# Patient Record
Sex: Female | Born: 1965
Health system: Southern US, Community
[De-identification: ages and names within clinical notes are randomized; demographics above are authoritative.]

## PROBLEM LIST (undated history)

## (undated) DIAGNOSIS — K219 Gastro-esophageal reflux disease without esophagitis: Secondary | ICD-10-CM

## (undated) DIAGNOSIS — F439 Reaction to severe stress, unspecified: Secondary | ICD-10-CM

## (undated) HISTORY — PX: FOOT SURGERY: SHX648

## (undated) HISTORY — DX: Reaction to severe stress, unspecified: F43.9

## (undated) HISTORY — PX: HALLUX VALGUS CORRECTION: SUR315

## (undated) HISTORY — PX: TONSILLECTOMY AND ADENOIDECTOMY: SUR1326

## (undated) HISTORY — PX: REFRACTIVE SURGERY: SHX103

## (undated) HISTORY — PX: DILATION AND CURETTAGE OF UTERUS: SHX78

## (undated) HISTORY — PX: TOE SURGERY: SHX1073

## (undated) HISTORY — PX: EYE SURGERY: SHX253

---

## 2004-05-25 ENCOUNTER — Observation Stay: Payer: Self-pay | Admitting: Unknown Physician Specialty

## 2004-06-15 ENCOUNTER — Inpatient Hospital Stay: Payer: Self-pay | Admitting: Unknown Physician Specialty

## 2004-12-23 ENCOUNTER — Ambulatory Visit: Payer: Self-pay | Admitting: Unknown Physician Specialty

## 2006-11-17 ENCOUNTER — Ambulatory Visit: Payer: Self-pay | Admitting: Unknown Physician Specialty

## 2007-12-28 ENCOUNTER — Ambulatory Visit: Payer: Self-pay | Admitting: Unknown Physician Specialty

## 2009-08-29 ENCOUNTER — Ambulatory Visit: Payer: Self-pay | Admitting: Unknown Physician Specialty

## 2010-01-07 ENCOUNTER — Ambulatory Visit: Payer: Self-pay | Admitting: Unknown Physician Specialty

## 2010-01-15 ENCOUNTER — Ambulatory Visit: Payer: Self-pay | Admitting: Unknown Physician Specialty

## 2010-01-20 LAB — PATHOLOGY REPORT

## 2010-06-17 ENCOUNTER — Ambulatory Visit: Payer: Self-pay | Admitting: Podiatry

## 2010-08-12 ENCOUNTER — Ambulatory Visit: Payer: Self-pay | Admitting: Unknown Physician Specialty

## 2011-08-31 ENCOUNTER — Ambulatory Visit: Payer: Self-pay | Admitting: Unknown Physician Specialty

## 2012-04-20 ENCOUNTER — Ambulatory Visit: Payer: Self-pay | Admitting: Unknown Physician Specialty

## 2013-02-16 ENCOUNTER — Ambulatory Visit: Payer: Self-pay | Admitting: Physician Assistant

## 2013-02-16 LAB — HCG, QUANTITATIVE, PREGNANCY: Beta Hcg, Quant.: <1 m[IU]/mL — ABNORMAL LOW

## 2013-08-06 ENCOUNTER — Ambulatory Visit: Payer: Self-pay | Admitting: Gastroenterology

## 2013-08-07 LAB — PATHOLOGY REPORT

## 2014-05-31 ENCOUNTER — Encounter: Payer: Self-pay | Admitting: Podiatry

## 2014-05-31 ENCOUNTER — Ambulatory Visit (INDEPENDENT_AMBULATORY_CARE_PROVIDER_SITE_OTHER): Payer: No Typology Code available for payment source

## 2014-05-31 ENCOUNTER — Ambulatory Visit (INDEPENDENT_AMBULATORY_CARE_PROVIDER_SITE_OTHER): Payer: No Typology Code available for payment source | Admitting: Podiatry

## 2014-05-31 VITALS — BP 141/90 | HR 87 | Resp 16 | Ht 69.0 in | Wt 175.0 lb

## 2014-05-31 DIAGNOSIS — M722 Plantar fascial fibromatosis: Secondary | ICD-10-CM

## 2014-05-31 MED ORDER — OXAPROZIN 600 MG PO TABS: 600.0000 mg | ORAL_TABLET | Freq: Every day | ORAL | Status: DC

## 2014-05-31 MED ORDER — TRIAMCINOLONE ACETONIDE 10 MG/ML IJ SUSP
10.0000 mg | Freq: Once | INTRAMUSCULAR | Status: AC
  Administered 2014-05-31: 10 mg

## 2014-05-31 NOTE — Patient Instructions (Signed)

## 2014-05-31 NOTE — Progress Notes (Signed)
   Subjective:    Patient ID: Tiffany Mata, female    DOB: 1965-09-10, 48 y.o.   MRN: [id number]  HPI Comments: My rt heel hurts in the back but the pain does not go up the leg. This has been going on for 3.5 months. i can be standing up and it will shoot pain. Walking hurts. The pain is getting worse. In the mornings and after sitting will hurt. i have used ice, ibuprofen and that's it.   Foot Pain      Review of Systems  All other systems reviewed and are negative.      Objective:   Physical Exam        Assessment & Plan:

## 2014-06-02 NOTE — Progress Notes (Signed)
Subjective:     Patient ID: Tiffany Mata, female   DOB: [birth year], 48 y.o.   MRN: [id number]  HPI patient presents stating I'm getting a lot of pain in the plantar aspect of my right heel that is been going on for about 4 months. It seems to be getting worse and increasingly affecting my activity levels   Review of Systems  All other systems reviewed and are negative.      Objective:   Physical Exam  Constitutional: She is oriented to person, place, and time.  Cardiovascular: Intact distal pulses.   Musculoskeletal: Normal range of motion.  Neurological: She is oriented to person, place, and time.  Skin: Skin is warm.  Nursing note and vitals reviewed.  neurovascular status intact with muscle strength adequate and range of motion subtalar midtarsal joint within normal limits. Patient is noted to have intense discomfort plantar aspect right heel    patient's digits are found to be well perfused she is well oriented 3 and does have moderate depression of the arch upon weightbearing  Assessment:     Severe plantar fasciitis right with mechanical dysfunction     Plan:     H&P and x-rays reviewed and today I injected the right plantar fascia 3 mg Kenalog 5 mg Xylocaine Marcaine mixture placed in a fascially brace and placed on diclofenac 75 mg twice a day. Instructed on physical therapy and reappoint in 1 week

## 2014-06-07 ENCOUNTER — Ambulatory Visit (INDEPENDENT_AMBULATORY_CARE_PROVIDER_SITE_OTHER): Payer: No Typology Code available for payment source | Admitting: Podiatry

## 2014-06-07 VITALS — BP 128/85 | HR 90 | Resp 16

## 2014-06-07 DIAGNOSIS — M722 Plantar fascial fibromatosis: Secondary | ICD-10-CM

## 2014-06-07 MED ORDER — TRIAMCINOLONE ACETONIDE 10 MG/ML IJ SUSP
10.0000 mg | Freq: Once | INTRAMUSCULAR | Status: AC
  Administered 2014-06-07: 10 mg

## 2014-06-08 NOTE — Progress Notes (Signed)
Subjective:     Patient ID: Tiffany Mata, female   DOB: March 30, 1966, 48 y.o.   MRN: [id number]  HPI patient states it's improving but it still painful on the plantar aspect right heel and one spot   Review of Systems     Objective:   Physical Exam Neurovascular status intact with continued pain of a diminished nature plantar aspect right heel at the insertion of the calcaneus    Assessment:     Plantar fasciitis right with inflammation and fluid buildup noted    Plan:     Reviewed condition and discussed physical therapy shoe gear modification and reinjected the plantar fascia 3 mg Kenalog 5 mg Xylocaine and reappoint if symptoms persist

## 2014-09-02 ENCOUNTER — Ambulatory Visit: Payer: Self-pay | Admitting: Family Medicine

## 2014-09-10 ENCOUNTER — Ambulatory Visit (INDEPENDENT_AMBULATORY_CARE_PROVIDER_SITE_OTHER): Payer: No Typology Code available for payment source | Admitting: Family Medicine

## 2014-09-10 ENCOUNTER — Encounter (INDEPENDENT_AMBULATORY_CARE_PROVIDER_SITE_OTHER): Payer: Self-pay

## 2014-09-10 ENCOUNTER — Encounter: Payer: Self-pay | Admitting: Family Medicine

## 2014-09-10 VITALS — BP 120/88 | HR 73 | Temp 97.8°F | Ht 68.75 in | Wt 175.5 lb

## 2014-09-10 DIAGNOSIS — Z0000 Encounter for general adult medical examination without abnormal findings: Secondary | ICD-10-CM | POA: Diagnosis not present

## 2014-09-10 DIAGNOSIS — Z638 Other specified problems related to primary support group: Secondary | ICD-10-CM | POA: Diagnosis not present

## 2014-09-10 DIAGNOSIS — Z01419 Encounter for gynecological examination (general) (routine) without abnormal findings: Secondary | ICD-10-CM

## 2014-09-10 DIAGNOSIS — Z720 Tobacco use: Secondary | ICD-10-CM

## 2014-09-10 DIAGNOSIS — G4700 Insomnia, unspecified: Secondary | ICD-10-CM

## 2014-09-10 DIAGNOSIS — F4323 Adjustment disorder with mixed anxiety and depressed mood: Secondary | ICD-10-CM | POA: Diagnosis not present

## 2014-09-10 LAB — COMPREHENSIVE METABOLIC PANEL
ALT: 20 U/L (ref 0–35)
AST: 22 U/L (ref 0–37)
Albumin: 4.4 g/dL (ref 3.5–5.2)
Alkaline Phosphatase: 65 U/L (ref 39–117)
BUN: 15 mg/dL (ref 6–23)
CO2: 30 mEq/L (ref 19–32)
CREATININE: 0.94 mg/dL (ref 0.40–1.20)
Calcium: 9.4 mg/dL (ref 8.4–10.5)
Chloride: 106 mEq/L (ref 96–112)
GFR: 66.88 mL/min (ref >60.00)
GLUCOSE: 92 mg/dL (ref 70–99)
POTASSIUM: 4.2 meq/L (ref 3.5–5.1)
Sodium: 139 mEq/L (ref 135–145)
Total Bilirubin: 0.4 mg/dL (ref 0.2–1.2)
Total Protein: 7 g/dL (ref 6.0–8.3)

## 2014-09-10 LAB — CBC WITH DIFFERENTIAL/PLATELET
BASOS ABS: 0 10*3/uL (ref 0.0–0.1)
Basophils Relative: 0.4 % (ref 0.0–3.0)
EOS PCT: 1.1 % (ref 0.0–5.0)
Eosinophils Absolute: 0.1 10*3/uL (ref 0.0–0.7)
HEMATOCRIT: 38.7 % (ref 36.0–46.0)
HEMOGLOBIN: 13.1 g/dL (ref 12.0–15.0)
Lymphocytes Relative: 33.4 % (ref 12.0–46.0)
Lymphs Abs: 1.9 10*3/uL (ref 0.7–4.0)
MCHC: 33.8 g/dL (ref 30.0–36.0)
MCV: 85.3 fl (ref 78.0–100.0)
MONOS PCT: 8.6 % (ref 3.0–12.0)
Monocytes Absolute: 0.5 10*3/uL (ref 0.1–1.0)
NEUTROS ABS: 3.2 10*3/uL (ref 1.4–7.7)
Neutrophils Relative %: 56.5 % (ref 43.0–77.0)
PLATELETS: 226 10*3/uL (ref 150.0–400.0)
RBC: 4.53 Mil/uL (ref 3.87–5.11)
RDW: 13.1 % (ref 11.5–15.5)
WBC: 5.7 10*3/uL (ref 4.0–10.5)

## 2014-09-10 LAB — LIPID PANEL
CHOL/HDL RATIO: 5
Cholesterol: 178 mg/dL (ref 0–200)
HDL: 37.6 mg/dL — ABNORMAL LOW (ref >39.00)
LDL CALC: 115 mg/dL — AB (ref 0–99)
NONHDL: 140.4
Triglycerides: 126 mg/dL (ref 0.0–149.0)
VLDL: 25.2 mg/dL (ref 0.0–40.0)

## 2014-09-10 LAB — TSH: TSH: 1.5 u[IU]/mL (ref 0.35–4.50)

## 2014-09-10 MED ORDER — SERTRALINE HCL 25 MG PO TABS: 25.0000 mg | ORAL_TABLET | Freq: Every day | ORAL | Status: DC

## 2014-09-10 NOTE — Patient Instructions (Signed)
It was nice to meet you. We are starting zoloft 25 mg daily. Please call me in a few weeks with an update.  We will call you with your lab results and you can view them online.

## 2014-09-10 NOTE — Progress Notes (Signed)
Subjective:   Patient ID: Tiffany Mata, female    DOB: 1965-08-12, 49 y.o.   MRN: [id number]  Tiffany Mata is a pleasant 49 y.o. year old female who presents to clinic today with Establish Care and Headache  on 09/10/2014  HPI:  Has GYN- UTD on mammogram/pap smear - 07/2014- awaiting records.  Under a lot of family stress- daughter was hospitalized for the flu.  Her mom is in a nursing home and her siblings are not very helpful or supportive with her care.  Her husband's company is experiencing lay offs.  Her dad committed suicide 14 years ago.  She is not sure if he had h/o depression. She feels that she is more anxious, tearful lately.  Not sleeping well.  Appetite ok. Has never taken any antidepressants or anxiolytics.  She does smoke during times of stress.  No current outpatient prescriptions on file prior to visit.   No current facility-administered medications on file prior to visit.    No Known Allergies  Past Medical History  Diagnosis Date  . Stress     Past Surgical History  Procedure Laterality Date  . Tonsillectomy and adenoidectomy    . Cesarean section    . Foot surgery      Family History  Problem Relation Age of Onset  . Stroke Mother   . Hypertension Mother   . Cancer Mother   . Diabetes Father     History   Social History  . Marital Status: Married    Spouse Name: N/A  . Number of Children: N/A  . Years of Education: N/A   Occupational History  . Not on file.   Social History Main Topics  . Smoking status: Current Every Day Smoker  . Smokeless tobacco: Never Used  . Alcohol Use: No  . Drug Use: No  . Sexual Activity: Yes   Other Topics Concern  . Not on file   Social History Narrative   The PMH, PSH, Social History, Family History, Medications, and allergies have been reviewed in [facility], and have been updated if relevant.     Review of Systems  Constitutional: Negative.   Eyes: Negative.   Respiratory: Negative.     Cardiovascular: Negative.   Gastrointestinal: Negative.   Endocrine: Negative.   Genitourinary: Negative.   Musculoskeletal: Negative.   Skin: Negative.   Allergic/Immunologic: Negative.   Neurological: Negative.   Hematological: Negative.   Psychiatric/Behavioral: Positive for sleep disturbance and dysphoric mood. Negative for suicidal ideas and self-injury. The patient is nervous/anxious.   All other systems reviewed and are negative.      Objective:    BP 120/88 mmHg  Pulse 73  Temp(Src) 97.8 F (36.6 C) (Oral)  Ht 5' 8.75" (1.746 m)  Wt 175 lb 8 oz (79.606 kg)  BMI 26.11 kg/m2  SpO2 98%   Physical Exam  Constitutional: She is oriented to person, place, and time. She appears well-developed and well-nourished. No distress.  HENT:  Head: Normocephalic and atraumatic.  Eyes: Conjunctivae are normal.  Cardiovascular: Normal rate and regular rhythm.   Pulmonary/Chest: Effort normal and breath sounds normal. No respiratory distress. She has no wheezes. She has no rales. She exhibits no tenderness.  Abdominal: Soft. Bowel sounds are normal.  Musculoskeletal: Normal range of motion. She exhibits no edema.  Neurological: She is alert and oriented to person, place, and time. No cranial nerve deficit.  Skin: Skin is warm and dry.  Psychiatric: Her speech is normal and behavior  is normal. Thought content normal. Her mood appears anxious.  Nursing note and vitals reviewed.         Assessment & Plan:   Insomnia  Stress due to family tension  Adjustment disorder with mixed anxiety and depressed mood No Follow-up on file.

## 2014-09-10 NOTE — Progress Notes (Signed)
Pre visit review using our clinic review tool, if applicable. No additional management support is needed unless otherwise documented below in the visit note. 

## 2014-09-10 NOTE — Assessment & Plan Note (Signed)
New- >30 minutes spent in face to face time with patient, >50% spent in counselling or coordination of care. Psychotherapy deferred. She would like to consider rx. Discussed options- eRx sent for zoloft 25 mg daily after discussing possible side effects/risks/benefits. She will call me in a few weeks with an update. The patient indicates understanding of these issues and agrees with the plan.

## 2014-09-11 ENCOUNTER — Encounter: Payer: Self-pay | Admitting: *Deleted

## 2014-09-11 ENCOUNTER — Telehealth: Payer: Self-pay | Admitting: Family Medicine

## 2014-09-11 NOTE — Telephone Encounter (Signed)
emmi emailed °

## 2014-10-01 ENCOUNTER — Other Ambulatory Visit: Payer: Self-pay | Admitting: *Deleted

## 2014-10-01 MED ORDER — IBUPROFEN 800 MG PO TABS: 800.0000 mg | ORAL_TABLET | Freq: Three times a day (TID) | ORAL | Status: DC | PRN

## 2014-10-01 NOTE — Telephone Encounter (Signed)
Patient called requesting a refill on her Ibuprofen 800 mg. Patient stated that her previous doctor gave her this to use for headaches and cramps. [facility]  Patient request a call back to let her know when this has been done.

## 2014-11-03 ENCOUNTER — Other Ambulatory Visit: Payer: Self-pay | Admitting: Family Medicine

## 2014-11-04 ENCOUNTER — Ambulatory Visit
Admission: RE | Admit: 2014-11-04 | Discharge: 2014-11-04 | Disposition: A | Discharge status: Home / Self Care | Payer: PRIVATE HEALTH INSURANCE | Source: Ambulatory Visit | Attending: Family Medicine | Admitting: Family Medicine

## 2014-11-04 ENCOUNTER — Other Ambulatory Visit: Payer: Self-pay | Admitting: Family Medicine

## 2014-11-04 DIAGNOSIS — M549 Dorsalgia, unspecified: Secondary | ICD-10-CM | POA: Diagnosis present

## 2014-11-04 DIAGNOSIS — R319 Hematuria, unspecified: Secondary | ICD-10-CM

## 2014-11-04 NOTE — Telephone Encounter (Signed)
Pt recently established care 08/2014

## 2014-11-21 ENCOUNTER — Ambulatory Visit
Admission: RE | Admit: 2014-11-21 | Discharge: 2014-11-21 | Disposition: A | Discharge status: Home / Self Care | Payer: PRIVATE HEALTH INSURANCE | Source: Ambulatory Visit | Attending: Family Medicine | Admitting: Family Medicine

## 2014-11-21 ENCOUNTER — Other Ambulatory Visit: Payer: Self-pay | Admitting: Family Medicine

## 2014-11-21 DIAGNOSIS — F17200 Nicotine dependence, unspecified, uncomplicated: Secondary | ICD-10-CM | POA: Insufficient documentation

## 2014-11-21 DIAGNOSIS — M542 Cervicalgia: Secondary | ICD-10-CM

## 2014-11-21 DIAGNOSIS — R05 Cough: Secondary | ICD-10-CM | POA: Diagnosis present

## 2014-11-21 DIAGNOSIS — R059 Cough, unspecified: Secondary | ICD-10-CM

## 2014-12-31 ENCOUNTER — Other Ambulatory Visit: Payer: Self-pay | Admitting: Family Medicine

## 2014-12-31 NOTE — Telephone Encounter (Signed)
Please refill 1 mo in light of PCP absence  Thanks

## 2014-12-31 NOTE — Telephone Encounter (Signed)
done

## 2014-12-31 NOTE — Telephone Encounter (Signed)
Dr. Deborra Medina is out of the office this week.  Please address.

## 2014-12-31 NOTE — Telephone Encounter (Signed)
Pt recently established care. Ok to refill?

## 2015-04-09 ENCOUNTER — Ambulatory Visit: Payer: Self-pay | Admitting: Physician Assistant

## 2015-04-09 ENCOUNTER — Encounter: Payer: Self-pay | Admitting: Physician Assistant

## 2015-04-09 VITALS — BP 100/70 | HR 91 | Temp 97.6°F | Wt 173.0 lb

## 2015-04-09 DIAGNOSIS — B839 Helminthiasis, unspecified: Secondary | ICD-10-CM

## 2015-04-09 DIAGNOSIS — B64 Unspecified protozoal disease: Secondary | ICD-10-CM

## 2015-04-09 MED ORDER — PRAZIQUANTEL 600 MG PO TABS
600.0000 mg | ORAL_TABLET | Freq: Once | ORAL | Status: DC
Start: 2015-04-09 — End: 2015-08-14

## 2015-04-09 MED ORDER — METRONIDAZOLE 500 MG PO TABS: 500.0000 mg | ORAL_TABLET | Freq: Three times a day (TID) | ORAL | Status: DC

## 2015-04-09 NOTE — Progress Notes (Signed)
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

## 2015-05-20 ENCOUNTER — Encounter: Payer: Self-pay | Admitting: Physician Assistant

## 2015-05-20 ENCOUNTER — Ambulatory Visit: Payer: Self-pay | Admitting: Physician Assistant

## 2015-05-20 VITALS — BP 120/80 | HR 96 | Temp 98.8°F

## 2015-05-20 DIAGNOSIS — J00 Acute nasopharyngitis [common cold]: Secondary | ICD-10-CM

## 2015-05-20 NOTE — Progress Notes (Signed)
S: C/o runny nose and congestion for 3 days, no fever, chills, cp/sob, v/d; mucus was clear throughout the day, cough is sporadic,   Using otc meds: robitussin  O: PE: perrl eomi, normocephalic, tms dull, nasal mucosa red and swollen, throat injected, neck supple no lymph, lungs c t a, cv rrr, neuro intact  A:  Acute viral uri   P: drink fluids, continue regular meds , use otc meds of choice, return if not improving in 5 days, return earlier if worsening

## 2015-08-14 ENCOUNTER — Ambulatory Visit (INDEPENDENT_AMBULATORY_CARE_PROVIDER_SITE_OTHER): Payer: Managed Care, Other (non HMO) | Admitting: Family Medicine

## 2015-08-14 ENCOUNTER — Encounter: Payer: Self-pay | Admitting: Family Medicine

## 2015-08-14 VITALS — BP 114/84 | HR 93 | Temp 98.0°F | Wt 166.2 lb

## 2015-08-14 DIAGNOSIS — F4321 Adjustment disorder with depressed mood: Secondary | ICD-10-CM | POA: Diagnosis not present

## 2015-08-14 DIAGNOSIS — F4323 Adjustment disorder with mixed anxiety and depressed mood: Secondary | ICD-10-CM | POA: Diagnosis not present

## 2015-08-14 DIAGNOSIS — G4700 Insomnia, unspecified: Secondary | ICD-10-CM

## 2015-08-14 MED ORDER — IBUPROFEN 800 MG PO TABS: 800.0000 mg | ORAL_TABLET | Freq: Three times a day (TID) | ORAL | Status: DC | PRN

## 2015-08-14 NOTE — Assessment & Plan Note (Addendum)
Currently feels she is coping ok. We did discuss grief counseling through hospice.  She may look into this.  >25 minutes spent in face to face time with patient, >50% spent in counselling or coordination of care

## 2015-08-14 NOTE — Assessment & Plan Note (Signed)
Likely due to grief. Discussed antihistamines and or melatonin to use as needed. The patient indicates understanding of these issues and agrees with the plan. Call or return to clinic prn if these symptoms worsen or fail to improve as anticipated.

## 2015-08-14 NOTE — Patient Instructions (Signed)
Good to see you. I am so sorry for your loss. Try tylenol PM or melatonin to help you sleep.

## 2015-08-14 NOTE — Progress Notes (Signed)
Pre visit review using our clinic review tool, if applicable. No additional management support is needed unless otherwise documented below in the visit note. 

## 2015-08-14 NOTE — Progress Notes (Signed)
Subjective:   Patient ID: Tiffany Mata, female    DOB: 19-May-1966, 50 y.o.   MRN: [id number]  Tiffany Mata is a pleasant 50 y.o. year old female who presents to clinic today with Follow-up  on 08/14/2015  HPI:  I have not seen pt since she established care with me on 09/10/14. Note reviewed. At that time, she was complaining of increased anxiety and insomnia.  She was under a lot of family stress at the time.  We started her on zoloft 25 mg daily.  She deferred psychotherapy at that time.  She is no longer taking zoloft- she didn't take it for long.  Felt it did not help much. Had been feeling less anxious and was sleeping better until last couple of months.  Her mom was on hospice and died last week.  She is more tearful, not eating as much or sleeping as well but feels she is coping ok.  "Mom is in a better place."  No SI or HI.    No current outpatient prescriptions on file prior to visit.   No current facility-administered medications on file prior to visit.    No Known Allergies  Past Medical History  Diagnosis Date  . Stress     Past Surgical History  Procedure Laterality Date  . Tonsillectomy and adenoidectomy    . Cesarean section    . Foot surgery      Family History  Problem Relation Age of Onset  . Stroke Mother   . Hypertension Mother   . Cancer Mother   . Diabetes Father     Social History   Social History  . Marital Status: Married    Spouse Name: N/A  . Number of Children: N/A  . Years of Education: N/A   Occupational History  . Not on file.   Social History Main Topics  . Smoking status: Current Every Day Smoker  . Smokeless tobacco: Never Used  . Alcohol Use: No  . Drug Use: No  . Sexual Activity: Yes   Other Topics Concern  . Not on file   Social History Narrative   Married   2 daughters- ages 46 and 85   She works as a Art therapist, husband works for [employer] [place], [place], Social History, Family History, Medications, and  allergies have been reviewed in [facility], and have been updated if relevant.   Review of Systems  Psychiatric/Behavioral: Positive for dysphoric mood and decreased concentration. Negative for suicidal ideas, hallucinations, behavioral problems, confusion, self-injury and agitation. The patient is nervous/anxious. The patient is not hyperactive.   All other systems reviewed and are negative.      Objective:    BP 114/84 mmHg  Pulse 93  Temp(Src) 98 F (36.7 C) (Oral)  Wt 166 lb 4 oz (75.411 kg)  SpO2 97%   Physical Exam  Constitutional: She is oriented to person, place, and time. She appears well-developed and well-nourished. No distress.  HENT:  Head: Normocephalic.  Eyes: Conjunctivae are normal.  Cardiovascular: Normal rate.   Pulmonary/Chest: Effort normal.  Musculoskeletal: Normal range of motion.  Neurological: She is alert and oriented to person, place, and time. No cranial nerve deficit.  Skin: Skin is warm and dry. She is not diaphoretic.  Psychiatric: She has a normal mood and affect. Her behavior is normal. Judgment and thought content normal.  Nursing note and vitals reviewed.         Assessment & Plan:   Adjustment  disorder with mixed anxiety and depressed mood  Insomnia  Grief No Follow-up on file.

## 2016-04-14 ENCOUNTER — Encounter: Payer: Self-pay | Admitting: Physician Assistant

## 2016-04-14 ENCOUNTER — Ambulatory Visit: Payer: Self-pay | Admitting: Physician Assistant

## 2016-04-14 ENCOUNTER — Ambulatory Visit
Admission: RE | Admit: 2016-04-14 | Discharge: 2016-04-14 | Disposition: A | Discharge status: Home / Self Care | Payer: Managed Care, Other (non HMO) | Source: Ambulatory Visit | Attending: Physician Assistant | Admitting: Physician Assistant

## 2016-04-14 VITALS — BP 151/97 | HR 88 | Temp 97.6°F

## 2016-04-14 DIAGNOSIS — R1031 Right lower quadrant pain: Secondary | ICD-10-CM

## 2016-04-14 DIAGNOSIS — N281 Cyst of kidney, acquired: Secondary | ICD-10-CM | POA: Diagnosis not present

## 2016-04-14 LAB — POCT URINALYSIS DIPSTICK
BILIRUBIN UA: NEGATIVE
GLUCOSE UA: NEGATIVE
KETONES UA: NEGATIVE
NITRITE UA: NEGATIVE
Protein, UA: NEGATIVE
Urobilinogen, UA: 0.2
pH, UA: 6.5

## 2016-04-14 NOTE — Addendum Note (Signed)
Addended by: Rudene Anda T on: 04/14/2016 01:57 PM   Modules accepted: Orders

## 2016-04-14 NOTE — Progress Notes (Addendum)
S: pt c/o rlq abdominal pain that woke her from her sleep last night, comes and goes in waves, no hx of kidney stones, does have appendix, last bm was today and it was normal, no v/d, no vag discharge, no uti sx, no fever/chills  O: vitals wnl, nad, lungs c t a, cv rrr, abd soft tender in rlq adjacent to umbilicus, no rebound tenderness, bs normal all 4 quads, n/v intact, ua 1+ blood, trace leuks  A: rlq pain  P: ct abd for renal stone Ct neg, pt should go to ER if abd pain is worsening  Pt's urine culture is +, sent rx for cipro 250bid x 7d to [facility] pharmacy

## 2016-04-18 LAB — URINE CULTURE

## 2016-04-19 MED ORDER — CIPROFLOXACIN HCL 250 MG PO TABS: 250.0000 mg | ORAL_TABLET | Freq: Two times a day (BID) | ORAL | 0 refills | Status: DC

## 2016-04-19 NOTE — Addendum Note (Signed)
Addended by: Versie Starks on: 04/19/2016 03:57 PM   Modules accepted: Orders

## 2016-05-17 ENCOUNTER — Other Ambulatory Visit: Payer: Self-pay | Admitting: Obstetrics and Gynecology

## 2016-05-17 DIAGNOSIS — Z1231 Encounter for screening mammogram for malignant neoplasm of breast: Secondary | ICD-10-CM

## 2016-05-27 ENCOUNTER — Ambulatory Visit: Payer: Self-pay | Admitting: Physician Assistant

## 2016-05-27 ENCOUNTER — Encounter: Payer: Self-pay | Admitting: Physician Assistant

## 2016-05-27 VITALS — BP 130/70 | HR 115 | Temp 98.3°F

## 2016-05-27 DIAGNOSIS — N390 Urinary tract infection, site not specified: Secondary | ICD-10-CM

## 2016-05-27 DIAGNOSIS — R319 Hematuria, unspecified: Principal | ICD-10-CM

## 2016-05-27 DIAGNOSIS — J069 Acute upper respiratory infection, unspecified: Secondary | ICD-10-CM

## 2016-05-27 LAB — POCT URINALYSIS DIPSTICK
Bilirubin, UA: NEGATIVE
GLUCOSE UA: NEGATIVE
Ketones, UA: NEGATIVE
NITRITE UA: NEGATIVE
PROTEIN UA: NEGATIVE
SPEC GRAV UA: 1.02
UROBILINOGEN UA: 1
pH, UA: 7

## 2016-05-27 MED ORDER — CEFDINIR 300 MG PO CAPS: 300.0000 mg | ORAL_CAPSULE | Freq: Two times a day (BID) | ORAL | 0 refills | Status: DC

## 2016-05-27 NOTE — Progress Notes (Signed)
S: C/o runny nose and congestion for 3 days, no fever, +chills, no cp/sob, v/d; mucus is green and thick, cough is sporadic, c/o of facial and dental pain. Just is really tired, also ?if uti cleared up from october  Using otc meds:   O: PE: vitals wnl, nad, pt appears tired, perrl eomi, normocephalic, tms dull, nasal mucosa red and swollen, throat injected, neck supple no lymph, lungs c t a, cv rrr, neuro intact, ua with trace leuks  A:  Acute sinusitis, uti   P: drink fluids, continue regular meds , use otc meds of choice, return if not improving in 5 days, return earlier if worsening , omnicef, urine culture, return after finishing last pill and will recheck urine

## 2016-05-29 LAB — URINE CULTURE

## 2016-07-01 ENCOUNTER — Encounter: Payer: Self-pay | Admitting: Physician Assistant

## 2016-07-01 ENCOUNTER — Ambulatory Visit: Payer: Self-pay | Admitting: Physician Assistant

## 2016-07-01 VITALS — BP 110/70 | HR 99 | Temp 97.5°F

## 2016-07-01 DIAGNOSIS — J069 Acute upper respiratory infection, unspecified: Secondary | ICD-10-CM

## 2016-07-01 MED ORDER — AZITHROMYCIN 250 MG PO TABS: ORAL_TABLET | ORAL | 0 refills | Status: DC

## 2016-07-01 NOTE — Progress Notes (Signed)
S: C/o runny nose and congestion for 3 days, no fever, chills, cp/sob, v/d; mucus was green this am but clear throughout the day, cough is sporadic, daughter has also been sick  Using otc meds: mucinex  O: PE: vitals wnl, nad, perrl eomi, normocephalic, tms dull, nasal mucosa red and swollen, throat injected, neck supple no lymph, lungs c t a, cv rrr, neuro intact  A:  Acute uri   P: drink fluids, continue regular meds , use otc meds of choice, return if not improving in 5 days, return earlier if worsening , zpack

## 2016-07-16 DIAGNOSIS — H9209 Otalgia, unspecified ear: Secondary | ICD-10-CM | POA: Diagnosis not present

## 2016-07-16 DIAGNOSIS — R591 Generalized enlarged lymph nodes: Secondary | ICD-10-CM | POA: Diagnosis not present

## 2016-07-28 ENCOUNTER — Ambulatory Visit
Admission: RE | Admit: 2016-07-28 | Discharge: 2016-07-28 | Disposition: A | Discharge status: Home / Self Care | Payer: Managed Care, Other (non HMO) | Source: Ambulatory Visit | Attending: Obstetrics and Gynecology | Admitting: Obstetrics and Gynecology

## 2016-07-28 DIAGNOSIS — Z1231 Encounter for screening mammogram for malignant neoplasm of breast: Secondary | ICD-10-CM

## 2016-07-30 ENCOUNTER — Encounter: Payer: Self-pay | Admitting: Physician Assistant

## 2016-07-30 ENCOUNTER — Ambulatory Visit: Payer: Self-pay | Admitting: Physician Assistant

## 2016-07-30 VITALS — BP 121/79 | HR 85 | Temp 97.5°F | Ht 69.0 in | Wt 170.0 lb

## 2016-07-30 DIAGNOSIS — Z0000 Encounter for general adult medical examination without abnormal findings: Secondary | ICD-10-CM

## 2016-07-30 NOTE — Progress Notes (Signed)
S: here for biometrics and physical, saw her gyn in last month, had a pap smear and mammogram, both normal, has had a colonoscopy, states she is going through "the change" but doesn't want to take hormones, no complaints, ros neg; pmhx unchanged, meds none, nkda, quit smoking a few months ago, fam hx is unchanged  O: vitals wnl, nad, ENT wnl, neck supple no lymph, lungs c t a, cv rrr, abd soft nontender bs normal all 4 quads, moves easily, walks normally  A: well adult  P: fasting labs today, exec panel, vit d, hep c, hiv

## 2016-07-31 LAB — CMP12+LP+TP+TSH+6AC+CBC/D/PLT
A/G RATIO: 1.8 (ref 1.2–2.2)
ALT: 71 IU/L — ABNORMAL HIGH (ref 0–32)
AST: 39 IU/L (ref 0–40)
Albumin: 4.4 g/dL (ref 3.5–5.5)
Alkaline Phosphatase: 115 IU/L (ref 39–117)
BASOS ABS: 0 10*3/uL (ref 0.0–0.2)
BILIRUBIN TOTAL: 0.3 mg/dL (ref 0.0–1.2)
BUN/Creatinine Ratio: 15 (ref 9–23)
BUN: 15 mg/dL (ref 6–24)
Basos: 0 %
CHOLESTEROL TOTAL: 205 mg/dL — AB (ref 100–199)
CREATININE: 1.02 mg/dL — AB (ref 0.57–1.00)
Calcium: 9.5 mg/dL (ref 8.7–10.2)
Chloride: 103 mmol/L (ref 96–106)
Chol/HDL Ratio: 4.8 ratio units — ABNORMAL HIGH (ref 0.0–4.4)
EOS (ABSOLUTE): 0.2 10*3/uL (ref 0.0–0.4)
Eos: 5 %
Estimated CHD Risk: 1.2 times avg. — ABNORMAL HIGH (ref 0.0–1.0)
FREE THYROXINE INDEX: 2.5 (ref 1.2–4.9)
GFR, EST AFRICAN AMERICAN: 73 mL/min/{1.73_m2} (ref >59)
GFR, EST NON AFRICAN AMERICAN: 63 mL/min/{1.73_m2} (ref >59)
GGT: 122 IU/L — AB (ref 0–60)
GLOBULIN, TOTAL: 2.4 g/dL (ref 1.5–4.5)
Glucose: 88 mg/dL (ref 65–99)
HDL: 43 mg/dL (ref >39)
Hematocrit: 35.7 % (ref 34.0–46.6)
Hemoglobin: 12 g/dL (ref 11.1–15.9)
IMMATURE GRANULOCYTES: 0 %
IRON: 39 ug/dL (ref 27–159)
Immature Grans (Abs): 0 10*3/uL (ref 0.0–0.1)
LDH: 208 IU/L (ref 119–226)
LDL Calculated: 144 mg/dL — ABNORMAL HIGH (ref 0–99)
LYMPHS: 34 %
Lymphocytes Absolute: 1.7 10*3/uL (ref 0.7–3.1)
MCH: 28.4 pg (ref 26.6–33.0)
MCHC: 33.6 g/dL (ref 31.5–35.7)
MCV: 85 fL (ref 79–97)
MONOCYTES: 10 %
Monocytes Absolute: 0.5 10*3/uL (ref 0.1–0.9)
Neutrophils Absolute: 2.5 10*3/uL (ref 1.4–7.0)
Neutrophils: 51 %
PHOSPHORUS: 3.7 mg/dL (ref 2.5–4.5)
PLATELETS: 263 10*3/uL (ref 150–379)
Potassium: 4.4 mmol/L (ref 3.5–5.2)
RBC: 4.22 x10E6/uL (ref 3.77–5.28)
RDW: 14 % (ref 12.3–15.4)
Sodium: 141 mmol/L (ref 134–144)
T3 UPTAKE RATIO: 27 % (ref 24–39)
T4 TOTAL: 9.1 ug/dL (ref 4.5–12.0)
TOTAL PROTEIN: 6.8 g/dL (ref 6.0–8.5)
TSH: 1.15 u[IU]/mL (ref 0.450–4.500)
Triglycerides: 90 mg/dL (ref 0–149)
Uric Acid: 5.4 mg/dL (ref 2.5–7.1)
VLDL CHOLESTEROL CAL: 18 mg/dL (ref 5–40)
WBC: 5 10*3/uL (ref 3.4–10.8)

## 2016-07-31 LAB — HEPATITIS C ANTIBODY (REFLEX): HCV Ab: <0.1 s/co ratio (ref 0.0–0.9)

## 2016-07-31 LAB — VITAMIN D 25 HYDROXY (VIT D DEFICIENCY, FRACTURES): VIT D 25 HYDROXY: 48.4 ng/mL (ref 30.0–100.0)

## 2016-07-31 LAB — HCV COMMENT:

## 2016-07-31 LAB — HIV ANTIBODY (ROUTINE TESTING W REFLEX): HIV Screen 4th Generation wRfx: NONREACTIVE

## 2016-09-17 ENCOUNTER — Ambulatory Visit: Payer: Self-pay | Admitting: Physician Assistant

## 2016-09-17 ENCOUNTER — Encounter: Payer: Self-pay | Admitting: Physician Assistant

## 2016-09-17 VITALS — BP 130/80 | HR 97 | Temp 97.7°F

## 2016-09-17 DIAGNOSIS — J069 Acute upper respiratory infection, unspecified: Secondary | ICD-10-CM

## 2016-09-17 MED ORDER — AZITHROMYCIN 250 MG PO TABS: ORAL_TABLET | ORAL | 0 refills | Status: DC

## 2016-09-17 NOTE — Progress Notes (Signed)
S: C/o runny nose and congestion for 3 days, no fever, chills, cp/sob, v/d; mucus was green this am but clear throughout the day, cough is sporadic,   Using otc meds: robitussin  O: PE: vitals wnl, nad, perrl eomi, normocephalic, tms dull, nasal mucosa red and swollen, throat injected, neck supple no lymph, lungs c t a, cv rrr, neuro intact  A:  Acute viral uri   P: drink fluids, continue regular meds , use otc meds of choice, return if not improving in 5 days, return earlier if worsening , if worsening over the weekend use zpack

## 2016-09-23 NOTE — Progress Notes (Signed)
Patient called and expressed that she has a bad cough and wanted to know what she can take.  I informed Letitia Neri, PA and she authorized me to call in Lewayne Bunting 100mg  [facility] [address].

## 2016-10-18 DIAGNOSIS — R1084 Generalized abdominal pain: Secondary | ICD-10-CM | POA: Diagnosis not present

## 2016-10-18 DIAGNOSIS — K5909 Other constipation: Secondary | ICD-10-CM | POA: Diagnosis not present

## 2016-11-11 ENCOUNTER — Emergency Department: Payer: Managed Care, Other (non HMO)

## 2016-11-11 ENCOUNTER — Emergency Department
Admission: EM | Admit: 2016-11-11 | Discharge: 2016-11-11 | Disposition: A | Discharge status: Home / Self Care | Payer: Managed Care, Other (non HMO) | Attending: Emergency Medicine | Admitting: Emergency Medicine

## 2016-11-11 ENCOUNTER — Encounter: Payer: Self-pay | Admitting: Emergency Medicine

## 2016-11-11 DIAGNOSIS — Z87891 Personal history of nicotine dependence: Secondary | ICD-10-CM | POA: Insufficient documentation

## 2016-11-11 DIAGNOSIS — Y998 Other external cause status: Secondary | ICD-10-CM | POA: Insufficient documentation

## 2016-11-11 DIAGNOSIS — Y9301 Activity, walking, marching and hiking: Secondary | ICD-10-CM | POA: Insufficient documentation

## 2016-11-11 DIAGNOSIS — S82832A Other fracture of upper and lower end of left fibula, initial encounter for closed fracture: Secondary | ICD-10-CM | POA: Diagnosis not present

## 2016-11-11 DIAGNOSIS — Y92015 Private garage of single-family (private) house as the place of occurrence of the external cause: Secondary | ICD-10-CM | POA: Insufficient documentation

## 2016-11-11 DIAGNOSIS — S8982XA Other specified injuries of left lower leg, initial encounter: Secondary | ICD-10-CM | POA: Diagnosis not present

## 2016-11-11 DIAGNOSIS — W010XXA Fall on same level from slipping, tripping and stumbling without subsequent striking against object, initial encounter: Secondary | ICD-10-CM | POA: Insufficient documentation

## 2016-11-11 MED ORDER — MELOXICAM 7.5 MG PO TABS: 7.5000 mg | ORAL_TABLET | Freq: Every day | ORAL | 1 refills | Status: AC

## 2016-11-11 MED ORDER — OXYCODONE-ACETAMINOPHEN 5-325 MG PO TABS
1.0000 | ORAL_TABLET | Freq: Once | ORAL | Status: AC
  Administered 2016-11-11: 1 via ORAL
  Filled 2016-11-11: qty 1

## 2016-11-11 MED ORDER — OXYCODONE-ACETAMINOPHEN 5-325 MG PO TABS: 1.0000 | ORAL_TABLET | Freq: Four times a day (QID) | ORAL | 0 refills | Status: AC | PRN

## 2016-11-11 MED ORDER — ONDANSETRON 4 MG PO TBDP
4.0000 mg | ORAL_TABLET | Freq: Once | ORAL | Status: AC
  Administered 2016-11-11: 4 mg via ORAL
  Filled 2016-11-11: qty 1

## 2016-11-11 NOTE — ED Notes (Signed)
Pt c/o RLE pain below the knee after a fall today around 1630; pt states pain at 8/10; pt is able to move all toes, full range of motion of the ankle with soreness, and full range of motion of the knee with no soreness; pt has good cap refill and pedal pulses distally from the injury.

## 2016-11-11 NOTE — ED Triage Notes (Signed)
Pt reports falling in garage today and felt her left leg give out from under her. Pt reports left leg pain from knee to ankle. Pt states it hurts to apply pressure. No obvious deformity noted in triage.

## 2016-11-11 NOTE — ED Provider Notes (Signed)
[facility] Emergency Department Provider Note  ____________________________________________  Time seen: Approximately 8:24 PM  I have reviewed the triage vital signs and the nursing notes.   HISTORY  Chief Complaint Fall and Leg Pain    HPI Tiffany Mata is a 51 y.o. female presenting to the emergency department with 8/10 acute left ankle pain. Patient states that she fell in the garage earlier today. Patient did not hit her head during the encounter. Patient denies radiculopathy or weakness of the left lower extremity. Patient denies prior surgeries to the left lower extremity. No alleviating measures have been attempted.   Past Medical History:  Diagnosis Date  . Stress     Patient Active Problem List   Diagnosis Date Noted  . Grief 08/14/2015  . Insomnia 09/10/2014  . Stress due to family tension 09/10/2014  . Adjustment disorder with mixed anxiety and depressed mood 09/10/2014  . Tobacco abuse 09/10/2014    Past Surgical History:  Procedure Laterality Date  . CESAREAN SECTION    . FOOT SURGERY    . TONSILLECTOMY AND ADENOIDECTOMY      Prior to Admission medications   Medication Sig Start Date End Date Taking? Authorizing Provider  azithromycin (ZITHROMAX Z-PAK) 250 MG tablet 2 pills today then 1 pill a day for 4 days 09/17/16   Versie Starks, PA-C    Allergies Patient has no known allergies.  Family History  Problem Relation Age of Onset  . Stroke Mother   . Hypertension Mother   . Cancer Mother   . Diabetes Father     Social History Social History  Substance Use Topics  . Smoking status: Former Research scientist (life sciences)  . Smokeless tobacco: Never Used  . Alcohol use No     Review of Systems  Constitutional: No fever/chills Eyes: No visual changes. No discharge ENT: No upper respiratory complaints. Cardiovascular: no chest pain. Respiratory: no cough. No SOB. Gastrointestinal: No abdominal pain.  No nausea, no vomiting.  No diarrhea.   No constipation. Musculoskeletal: Patient has left ankle pain. Skin: Negative for rash, abrasions, lacerations, ecchymosis. Neurological: Negative for headaches, focal weakness or numbness. ____________________________________________   PHYSICAL EXAM:  VITAL SIGNS: ED Triage Vitals [11/11/16 1804]  Enc Vitals Group     BP (!) 157/105     Pulse Rate 85     Resp 18     Temp 98.1 F (36.7 C)     Temp Source Oral     SpO2 100 %     Weight 163 lb (73.9 kg)     Height 5\' 9"  (1.753 m)     Head Circumference      Peak Flow      Pain Score 8     Pain Loc      Pain Edu?      Excl. in [place]?      Constitutional: Alert and oriented. Well appearing and in no acute distress. Eyes: Conjunctivae are normal. PERRL. EOMI. Head: Atraumatic.  Cardiovascular: Normal rate, regular rhythm. Normal S1 and S2.  Good peripheral circulation. Respiratory: Normal respiratory effort without tachypnea or retractions. Lungs CTAB. Good air entry to the bases with no decreased or absent breath sounds. Musculoskeletal: Patient has 5 out of 5 strength in the lower extremities bilaterally. Patient is able to move all 5 left toes. Patient is able to perform limited flexion and extension at the left ankle, likely secondary to pain. Palpable dorsalis pedis pulses bilaterally and symmetrically. Neurologic:  Normal speech and language.  No gross focal neurologic deficits are appreciated.  Skin:  Skin is warm, dry and intact. No rash noted. Psychiatric: Mood and affect are normal. Speech and behavior are normal. Patient exhibits appropriate insight and judgement.   ____________________________________________   LABS (all labs ordered are listed, but only abnormal results are displayed)  Labs Reviewed - No data to display ____________________________________________  EKG   ____________________________________________  RADIOLOGY Unk Pinto, personally viewed and evaluated these images (plain radiographs)  as part of my medical decision making, as well as reviewing the written report by the radiologist.  Dg Tibia/fibula Left  Result Date: 11/11/2016 CLINICAL DATA:  Pt reports falling in garage today and felt her left leg give out from under her. Pt reports left leg pain from knee to ankle. EXAM: LEFT TIBIA AND FIBULA - 2 VIEW COMPARISON:  None. FINDINGS: There is an oblique fracture of the distal fibula. This extends from the posterior distal shaft to the anterior metaphysis. Fracture is nondisplaced and non comminuted. No other fractures.  The knee and ankle joints are normally aligned. There is mild soft tissue swelling along the lateral aspect of the lower leg and ankle. IMPRESSION: Nondisplaced fracture of the distal fibula as described. Electronically Signed   By: Lajean Manes M.D.   On: 11/11/2016 20:04   Dg Ankle 2 Views Left  Result Date: 11/11/2016 CLINICAL DATA:  Pt reports falling in garage today and felt her left leg give out from under her. Pt reports left leg pain from knee to ankle. EXAM: LEFT ANKLE - 2 VIEW COMPARISON:  None. FINDINGS: There is an oblique nondisplaced fracture of the distal fibula. This extends from the posterior distal diaphysis to the anterior metaphysis. No fracture angulation. Small bony fragments noted along the anterior inferior margin of the fracture. No other fracture comminution. There are no other fractures. Ankle mortise is normally spaced and aligned. There is lateral soft tissue swelling. IMPRESSION: 1. Nondisplaced fracture of the distal left fibula as described. No other fractures. No dislocation. Electronically Signed   By: Lajean Manes M.D.   On: 11/11/2016 20:05    ____________________________________________    PROCEDURES  Procedure(s) performed:    Procedures    Medications  oxyCODONE-acetaminophen (PERCOCET/ROXICET) 5-325 MG per tablet 1 tablet (not administered)     ____________________________________________   INITIAL IMPRESSION  / ASSESSMENT AND PLAN / ED COURSE  Pertinent labs & imaging results that were available during my care of the patient were reviewed by me and considered in my medical decision making (see chart for details).  Review of the [place] CSRS was performed in accordance of the [place] prior to dispensing any controlled drugs.    Assessment and plan: Nondisplaced left fibular fracture: Patient presents to the emergency department with left ankle pain after falling in her ride chart. DG left ankle reveals a nondisplaced fibular fracture, distal. A splint was applied in the emergency department. Patient was neurovascularly intact after splint application. Roxicet was given in the emergency department for pain. Patient was discharged with Roxicet and Mobic. Vital signs were reassuring at discharge aside from hypertension. All patient questions were answered.  ____________________________________________  FINAL CLINICAL IMPRESSION(S) / ED DIAGNOSES  Final diagnoses:  None      NEW MEDICATIONS STARTED DURING THIS VISIT:  New Prescriptions   No medications on file        This chart was dictated using voice recognition software/Dragon. Despite best efforts to proofread, errors can occur which can change the meaning. Any  change was purely unintentional.    Lannie Fields, PA-C 11/11/16 2036    Harvest Dark, MD 11/11/16 [phone]

## 2016-11-11 NOTE — ED Notes (Signed)

## 2016-11-12 DIAGNOSIS — S8265XA Nondisplaced fracture of lateral malleolus of left fibula, initial encounter for closed fracture: Secondary | ICD-10-CM | POA: Diagnosis not present

## 2016-11-18 DIAGNOSIS — S8265XD Nondisplaced fracture of lateral malleolus of left fibula, subsequent encounter for closed fracture with routine healing: Secondary | ICD-10-CM | POA: Diagnosis not present

## 2016-11-25 DIAGNOSIS — S8265XD Nondisplaced fracture of lateral malleolus of left fibula, subsequent encounter for closed fracture with routine healing: Secondary | ICD-10-CM | POA: Diagnosis not present

## 2016-12-06 DIAGNOSIS — S8265XD Nondisplaced fracture of lateral malleolus of left fibula, subsequent encounter for closed fracture with routine healing: Secondary | ICD-10-CM | POA: Diagnosis not present

## 2016-12-20 DIAGNOSIS — S8265XD Nondisplaced fracture of lateral malleolus of left fibula, subsequent encounter for closed fracture with routine healing: Secondary | ICD-10-CM | POA: Diagnosis not present

## 2016-12-22 DIAGNOSIS — H6981 Other specified disorders of Eustachian tube, right ear: Secondary | ICD-10-CM | POA: Diagnosis not present

## 2016-12-23 DIAGNOSIS — S8265XD Nondisplaced fracture of lateral malleolus of left fibula, subsequent encounter for closed fracture with routine healing: Secondary | ICD-10-CM | POA: Diagnosis not present

## 2016-12-28 DIAGNOSIS — S8265XD Nondisplaced fracture of lateral malleolus of left fibula, subsequent encounter for closed fracture with routine healing: Secondary | ICD-10-CM | POA: Diagnosis not present

## 2016-12-31 DIAGNOSIS — S8265XD Nondisplaced fracture of lateral malleolus of left fibula, subsequent encounter for closed fracture with routine healing: Secondary | ICD-10-CM | POA: Diagnosis not present

## 2017-01-03 DIAGNOSIS — S8265XD Nondisplaced fracture of lateral malleolus of left fibula, subsequent encounter for closed fracture with routine healing: Secondary | ICD-10-CM | POA: Diagnosis not present

## 2017-01-04 DIAGNOSIS — S8265XD Nondisplaced fracture of lateral malleolus of left fibula, subsequent encounter for closed fracture with routine healing: Secondary | ICD-10-CM | POA: Diagnosis not present

## 2017-01-04 DIAGNOSIS — S93522A Sprain of metatarsophalangeal joint of left great toe, initial encounter: Secondary | ICD-10-CM | POA: Diagnosis not present

## 2017-01-06 DIAGNOSIS — S8265XD Nondisplaced fracture of lateral malleolus of left fibula, subsequent encounter for closed fracture with routine healing: Secondary | ICD-10-CM | POA: Diagnosis not present

## 2017-01-18 DIAGNOSIS — S8265XD Nondisplaced fracture of lateral malleolus of left fibula, subsequent encounter for closed fracture with routine healing: Secondary | ICD-10-CM | POA: Diagnosis not present

## 2017-01-19 ENCOUNTER — Ambulatory Visit (INDEPENDENT_AMBULATORY_CARE_PROVIDER_SITE_OTHER): Payer: Managed Care, Other (non HMO) | Admitting: Podiatry

## 2017-01-19 ENCOUNTER — Ambulatory Visit (INDEPENDENT_AMBULATORY_CARE_PROVIDER_SITE_OTHER): Payer: Managed Care, Other (non HMO)

## 2017-01-19 DIAGNOSIS — M2032 Hallux varus (acquired), left foot: Secondary | ICD-10-CM | POA: Diagnosis not present

## 2017-01-19 DIAGNOSIS — S90112A Contusion of left great toe without damage to nail, initial encounter: Secondary | ICD-10-CM | POA: Diagnosis not present

## 2017-01-19 DIAGNOSIS — M2042 Other hammer toe(s) (acquired), left foot: Secondary | ICD-10-CM | POA: Diagnosis not present

## 2017-01-19 NOTE — Progress Notes (Signed)
She presents today as an inpatient with a chief complaint of a painful first and second toe of her left foot. States that she fell in May 2018 broke her fibula which is going to heal. She states that the same time she injured her big toe and her second toe which had previously had bunionectomies and an arthroplasty. She states that the toes are now painful big toe sits out like a thumb bothers her with her daily activities.  Objective: I have reviewed her past history medications allergies surgeries and social history. She is in no apparent distress. Pulses are strong with palpable. Neurologic sensorium is intact. Deep tendon reflexes are intact muscle strength is normal. Orthopedic evaluation does demonstrate flexible reducible hallux varus which is acquired due to injury. She also has a flexible and reducible contracted second metatarsophalangeal joint with a mild hammertoe deformity. Radiographs taken today demonstrate what appears to be a silver bunionectomy in the past and arthroplasty to PIPJ second digit. No avulsion fractures are identified. Cutaneous evaluation does not demonstrate any open lesions. Skin is supple and well hydrated.  Assessment: Acquired hallux varus left. Contracted second metatarsophalangeal joint with hammertoe deformity left foot.  Plan: I discussed the etiology pathology conservative versus surgical therapies with her. At this point I recommended that she follow-up with Dr. Amalia Hailey for possible first metatarsophalangeal joint fusion second metatarsal osteotomy hammertoe repair with pin or screw. She is amenable to this if necessary. She will follow-up with him on Friday for consult.  Also discussed with her a procedure that is similar to the tight rope. Where a heavy braided suture is placed through the proximal phalanx from medial to lateral passed back through the head of the first metatarsal from lateral to medial I don't know what this is called that this does allow for  flexibility of the joint. I would be concerned about the integrity of the repair however.  Roselind Messier DPM

## 2017-01-20 DIAGNOSIS — S8265XD Nondisplaced fracture of lateral malleolus of left fibula, subsequent encounter for closed fracture with routine healing: Secondary | ICD-10-CM | POA: Diagnosis not present

## 2017-01-21 ENCOUNTER — Ambulatory Visit (INDEPENDENT_AMBULATORY_CARE_PROVIDER_SITE_OTHER): Payer: Managed Care, Other (non HMO) | Admitting: Podiatry

## 2017-01-21 ENCOUNTER — Telehealth: Payer: Self-pay | Admitting: *Deleted

## 2017-01-21 DIAGNOSIS — M2042 Other hammer toe(s) (acquired), left foot: Secondary | ICD-10-CM

## 2017-01-21 DIAGNOSIS — M2032 Hallux varus (acquired), left foot: Secondary | ICD-10-CM

## 2017-01-21 NOTE — Telephone Encounter (Signed)
"  I just left the [facility] office seeing Dr. Amalia Hailey.  I'd like to schedule my surgery."  I don't have your information but I can schedule you tentatively.  Do you have a date in mind?  "I need to let my job know as soon as possible that's why I'm calling.  I would like to do this on February 24, 2017."  He does have time available.  I will put you down tentatively.  If there's any problems, I'll give you a call.

## 2017-01-23 NOTE — Progress Notes (Signed)
   HPI: 51 year old female presents to the office today for evaluation of left foot pathology. Patient was referral from Dr. Milinda Pointer for an acquired hallux varus of the left foot secondary to trauma. Patient also has a contracted second digit of the left foot. Patient states that May 2018 she sustained an injury and she broke her leg. She believes that at the time she also injured her foot however is overlooked by the orthopedist she was seen for the broken leg. She states that the toes are now painful and her great toe to the left lower extremity is in a varus position. Patient presents today for surgical consultation and further treatment and evaluation   Physical Exam: General: The patient is alert and oriented x3 in no acute distress.  Dermatology: Skin is warm, dry and supple bilateral lower extremities. Negative for open lesions or macerations.  Vascular: Palpable pedal pulses bilaterally. No edema or erythema noted. Capillary refill within normal limits.  Neurological: Epicritic and protective threshold grossly intact bilaterally.   Musculoskeletal Exam: Clinical evidence of hallux varus deformity noted at the level of the MTPJ. Hammertoe contracture deformity also noted to the second digit left foot. Range of motion within normal limits to all pedal and ankle joints bilateral. Muscle strength 5/5 in all groups bilateral.   Radiographic Exam:  Normal osseous mineralization. Joint spaces preserved. No fracture/dislocation/boney destruction.  There is medial deviation of the proximal phalanx and an incongruent joint of the first MTPJ. Hammertoe contracture deformity also noted to the second digit left foot with lateral deviation of the toe  Assessment: 1. Hallux varus secondary to trauma left foot 2. Hammertoe deformity second digit left foot   Plan of Care:  1. Patient was evaluated. X-rays reviewed today 2. Today I discussed with the patient the surgical management regarding hallux varus  deformity. At the moment the varus deformity does not appear to be severe based on radiographic exam clinical findings. I believe that a soft tissue rebalancing should help to alleviate symptoms and reposition the left great toe in a more rectus position. No need for suture or orthopedic hardware. All possible complications and details the procedure were explained. All patient questions were answered. No guarantees were expressed or implied 3. Today authorization for surgery was initiated. Surgery will consist of correction of hallux varus left foot with soft tissue rebalancing. PIPJ arthroplasty second digit left foot. MTPJ capsulotomy second digit left foot. 4. Return to clinic 1 week postop   Edrick Kins, DPM [facility]  Dr. Edrick Kins, DPM    [address], [place] [address]                Office [phone]  Fax [phone]

## 2017-01-25 DIAGNOSIS — S8265XD Nondisplaced fracture of lateral malleolus of left fibula, subsequent encounter for closed fracture with routine healing: Secondary | ICD-10-CM | POA: Diagnosis not present

## 2017-01-27 DIAGNOSIS — S8265XD Nondisplaced fracture of lateral malleolus of left fibula, subsequent encounter for closed fracture with routine healing: Secondary | ICD-10-CM | POA: Diagnosis not present

## 2017-02-02 DIAGNOSIS — S8265XD Nondisplaced fracture of lateral malleolus of left fibula, subsequent encounter for closed fracture with routine healing: Secondary | ICD-10-CM | POA: Diagnosis not present

## 2017-02-22 ENCOUNTER — Telehealth: Payer: Self-pay | Admitting: *Deleted

## 2017-02-22 NOTE — Telephone Encounter (Signed)
"  I've got surgery Thursday, the thirthieth with Dr. Amalia Hailey.   I am trying to see if somebody knew the time or something because I have children in school.  I have to arrange somebody to take them or take me there.  I'd appreciate if you could give me a call back.  Thank you"

## 2017-02-24 ENCOUNTER — Encounter: Payer: Self-pay | Admitting: Podiatry

## 2017-02-24 DIAGNOSIS — M2032 Hallux varus (acquired), left foot: Secondary | ICD-10-CM | POA: Diagnosis not present

## 2017-02-24 DIAGNOSIS — M7752 Other enthesopathy of left foot: Secondary | ICD-10-CM | POA: Diagnosis not present

## 2017-02-24 DIAGNOSIS — M2042 Other hammer toe(s) (acquired), left foot: Secondary | ICD-10-CM | POA: Diagnosis not present

## 2017-02-24 NOTE — Telephone Encounter (Signed)
Caren Griffins from [facility] called the patient with the arrival time on 02/23/2017.

## 2017-03-01 NOTE — Progress Notes (Signed)
DOS 02/24/17 Correction of hallux varus Lt foot, hammertoe correction 2nd Lt

## 2017-03-04 ENCOUNTER — Ambulatory Visit (INDEPENDENT_AMBULATORY_CARE_PROVIDER_SITE_OTHER): Payer: Managed Care, Other (non HMO)

## 2017-03-04 ENCOUNTER — Ambulatory Visit (INDEPENDENT_AMBULATORY_CARE_PROVIDER_SITE_OTHER): Payer: Self-pay | Admitting: Podiatry

## 2017-03-04 DIAGNOSIS — M2042 Other hammer toe(s) (acquired), left foot: Secondary | ICD-10-CM | POA: Diagnosis not present

## 2017-03-04 DIAGNOSIS — M2032 Hallux varus (acquired), left foot: Secondary | ICD-10-CM | POA: Diagnosis not present

## 2017-03-04 DIAGNOSIS — Z9889 Other specified postprocedural states: Secondary | ICD-10-CM

## 2017-03-08 NOTE — Progress Notes (Signed)
   Subjective:  Patient presents today status post correction of hallux varus and 2nd hammertoe of left foot. DOS: 02/24/17. She states she is doing well but is having throbbing pain intermittently. She denies fever or chills. She denies any new complaints at this time. She is here for further evaluation and treatment.      Objective/Physical Exam Skin incisions appear to be well coapted with sutures and staples intact. No sign of infectious process noted. No dehiscence. No active bleeding noted. Moderate edema noted to the surgical extremity.  Radiographic Exam:  Orthopedic hardware and osteotomies sites appear to be stable with routine healing.  Assessment: 1. s/p correction of hallux varus and 2nd hammertoe of left foot. DOS: 02/24/17.   Plan of Care:  1. Patient was evaluated. X-rays reviewed 2. Dressing changed. 3. Continue wearing CAM boot. 4. Return to clinic in 1 week.   Edrick Kins, DPM [facility]  Dr. Edrick Kins, [address]                                        [place], [place] [address]                Office [phone]  Fax [phone]

## 2017-03-11 ENCOUNTER — Ambulatory Visit (INDEPENDENT_AMBULATORY_CARE_PROVIDER_SITE_OTHER): Payer: Managed Care, Other (non HMO) | Admitting: Podiatry

## 2017-03-11 ENCOUNTER — Encounter: Payer: Managed Care, Other (non HMO) | Admitting: Podiatry

## 2017-03-11 DIAGNOSIS — Z9889 Other specified postprocedural states: Secondary | ICD-10-CM | POA: Diagnosis not present

## 2017-03-14 NOTE — Progress Notes (Signed)
   Subjective:  Patient presents today status post correction of hallux varus and 2nd hammertoe of left foot. DOS: 02/24/17. She states she is doing well but is still having intermittnet throbbing pain at night. She denies any new complaints at this time. She is here for further evaluation and treatment.    Past Medical History:  Diagnosis Date  . Stress      Objective/Physical Exam Skin incisions appear to be well coapted with sutures and staples intact. No sign of infectious process noted. No dehiscence. No active bleeding noted. Moderate edema noted to the surgical extremity.  Assessment: 1. s/p correction of hallux varus and 2nd hammertoe of left foot. DOS: 02/24/17.   Plan of Care:  1. Patient was evaluated.  2. Discontinue wearing cam boot. Postop shoe dispensed. 3. Sutures removed. 4. Recommended iodine ointment and Band-Aid daily to fixation pins. 5. Return to clinic in 2 weeks for percutaneous pin removal.   Edrick Kins, DPM [facility]  Dr. Edrick Kins, [address]                                        [place], [place] [address]                Office [phone]  Fax [phone]

## 2017-03-15 ENCOUNTER — Encounter: Payer: Managed Care, Other (non HMO) | Admitting: Podiatry

## 2017-03-18 ENCOUNTER — Telehealth: Payer: Self-pay | Admitting: Podiatry

## 2017-03-18 MED ORDER — OXYCODONE-ACETAMINOPHEN 5-325 MG PO TABS: 1.0000 | ORAL_TABLET | Freq: Four times a day (QID) | ORAL | 0 refills | Status: DC | PRN

## 2017-03-18 NOTE — Telephone Encounter (Signed)
Needs pain med refill °

## 2017-03-18 NOTE — Telephone Encounter (Signed)
Please refill percocet 5/325mg  #30 q6h. She can pick up in office.  Thanks, Dr. Amalia Hailey

## 2017-03-18 NOTE — Addendum Note (Signed)
Addended by: Graceann Congress D on: 03/18/2017 11:45 AM   Modules accepted: Orders

## 2017-03-18 NOTE — Telephone Encounter (Signed)
Rx has been printed and left at front desk for patient pick up.  Patient notified of rx via voice mail.

## 2017-03-23 ENCOUNTER — Other Ambulatory Visit: Payer: Self-pay | Admitting: Family Medicine

## 2017-03-25 ENCOUNTER — Ambulatory Visit (INDEPENDENT_AMBULATORY_CARE_PROVIDER_SITE_OTHER): Payer: Managed Care, Other (non HMO) | Admitting: Podiatry

## 2017-03-25 ENCOUNTER — Ambulatory Visit (INDEPENDENT_AMBULATORY_CARE_PROVIDER_SITE_OTHER): Payer: Managed Care, Other (non HMO)

## 2017-03-25 ENCOUNTER — Encounter: Payer: Managed Care, Other (non HMO) | Admitting: Podiatry

## 2017-03-25 ENCOUNTER — Encounter: Payer: Self-pay | Admitting: Podiatry

## 2017-03-25 DIAGNOSIS — Z9889 Other specified postprocedural states: Secondary | ICD-10-CM | POA: Diagnosis not present

## 2017-03-25 MED ORDER — OXYCODONE-ACETAMINOPHEN 5-325 MG PO TABS: 1.0000 | ORAL_TABLET | Freq: Four times a day (QID) | ORAL | 0 refills | Status: DC | PRN

## 2017-03-25 NOTE — Progress Notes (Signed)
   Subjective:  Patient presents today status post correction of hallux varus and 2nd hammertoe of left foot. DOS: 02/24/17. Patient is doing very well. Pain is very tolerable. She has no new complaints at this time   Past Medical History:  Diagnosis Date  . Stress      Objective/Physical Exam Neurovascular status intact. Skin incisions appear to be well coapted with sutures and staples intact. No sign of infectious process noted. No dehiscence. No active bleeding noted. Moderate edema noted to the surgical extremity.  Radiographic exam: Stable osteotomy sites with intact orthopedic hardware left foot  Assessment: 1. s/p correction of hallux varus and 2nd hammertoe of left foot. DOS: 02/24/17.   Plan of Care:  1. Patient was evaluated. X-rays reviewed today 2. Today the percutaneous fixation pins were removed. Dry sterile dressings were applied. 3. Refill prescription for Percocet 5/325 mg 4. Patient can begin wearing normal shoe gear. 5. Return to clinic in 4 weeks   Edrick Kins, DPM [facility]  Dr. Edrick Kins, [address]                                        [place], [place] [address]                Office [phone]  Fax [phone]

## 2017-04-05 ENCOUNTER — Ambulatory Visit: Payer: Self-pay | Admitting: Physician Assistant

## 2017-04-05 ENCOUNTER — Encounter: Payer: Self-pay | Admitting: Physician Assistant

## 2017-04-05 VITALS — BP 139/89 | HR 91 | Temp 98.5°F | Resp 16

## 2017-04-05 DIAGNOSIS — K068 Other specified disorders of gingiva and edentulous alveolar ridge: Secondary | ICD-10-CM

## 2017-04-05 DIAGNOSIS — J392 Other diseases of pharynx: Secondary | ICD-10-CM

## 2017-04-05 DIAGNOSIS — R5383 Other fatigue: Secondary | ICD-10-CM

## 2017-04-05 MED ORDER — AZITHROMYCIN 250 MG PO TABS: ORAL_TABLET | ORAL | 0 refills | Status: DC

## 2017-04-05 MED ORDER — DEXAMETHASONE SODIUM PHOSPHATE 10 MG/ML IJ SOLN
10.0000 mg | Freq: Once | INTRAMUSCULAR | Status: AC
  Administered 2017-04-05: 10 mg via INTRAMUSCULAR

## 2017-04-05 NOTE — Progress Notes (Signed)
S: c/o feeling like her throat is swelling up, states she is really tired and fatigued, noticed her gums were bleeding this morning, does bruise easily, no nosebleeds, states there were a lot of mosquitoes around her this weekend but doesn't remember being bit by one, no cp/sob, just doesn't feel well  O: vitals wnl, nad, pt appears pale and tired, some bruising on forearms, tms clear, throat red at upper palate, neck supple no lymph, lungs c t a, cv rrr  A: fatigue, bleeding gums, pharyngitis  P: zpack, labs, decadron as pt is concerned her throat will close up

## 2017-04-06 LAB — CBC WITH DIFFERENTIAL/PLATELET
BASOS: 1 %
Basophils Absolute: 0 10*3/uL (ref 0.0–0.2)
EOS (ABSOLUTE): 0.4 10*3/uL (ref 0.0–0.4)
EOS: 5 %
HEMOGLOBIN: 13.4 g/dL (ref 11.1–15.9)
Hematocrit: 39.4 % (ref 34.0–46.6)
IMMATURE GRANS (ABS): 0 10*3/uL (ref 0.0–0.1)
Immature Granulocytes: 0 %
LYMPHS: 26 %
Lymphocytes Absolute: 2.1 10*3/uL (ref 0.7–3.1)
MCH: 29 pg (ref 26.6–33.0)
MCHC: 34 g/dL (ref 31.5–35.7)
MCV: 85 fL (ref 79–97)
MONOCYTES: 7 %
Monocytes Absolute: 0.6 10*3/uL (ref 0.1–0.9)
NEUTROS ABS: 4.8 10*3/uL (ref 1.4–7.0)
Neutrophils: 61 %
PLATELETS: 237 10*3/uL (ref 150–379)
RBC: 4.62 x10E6/uL (ref 3.77–5.28)
RDW: 15.3 % (ref 12.3–15.4)
WBC: 7.9 10*3/uL (ref 3.4–10.8)

## 2017-04-06 LAB — B12 AND FOLATE PANEL
FOLATE: 19.1 ng/mL (ref >3.0)
VITAMIN B 12: 674 pg/mL (ref 232–1245)

## 2017-04-06 LAB — THYROID PANEL WITH TSH
Free Thyroxine Index: 1.9 (ref 1.2–4.9)
T3 Uptake Ratio: 25 % (ref 24–39)
T4, Total: 7.5 ug/dL (ref 4.5–12.0)
TSH: 1.75 u[IU]/mL (ref 0.450–4.500)

## 2017-04-06 LAB — VITAMIN D 25 HYDROXY (VIT D DEFICIENCY, FRACTURES): Vit D, 25-Hydroxy: 36.6 ng/mL (ref 30.0–100.0)

## 2017-04-22 ENCOUNTER — Ambulatory Visit (INDEPENDENT_AMBULATORY_CARE_PROVIDER_SITE_OTHER): Payer: Managed Care, Other (non HMO)

## 2017-04-22 ENCOUNTER — Ambulatory Visit (INDEPENDENT_AMBULATORY_CARE_PROVIDER_SITE_OTHER): Payer: Managed Care, Other (non HMO) | Admitting: Podiatry

## 2017-04-22 DIAGNOSIS — M2032 Hallux varus (acquired), left foot: Secondary | ICD-10-CM | POA: Diagnosis not present

## 2017-04-22 DIAGNOSIS — M2042 Other hammer toe(s) (acquired), left foot: Secondary | ICD-10-CM | POA: Diagnosis not present

## 2017-04-22 DIAGNOSIS — Z9889 Other specified postprocedural states: Secondary | ICD-10-CM

## 2017-04-25 NOTE — Progress Notes (Signed)
   Subjective:  Patient presents today status post correction of hallux varus and 2nd hammertoe of left foot. DOS: 02/24/17. Patient is doing very well and denies any new complaints at this time. She is here for further evaluation and treatment.    Past Medical History:  Diagnosis Date  . Stress      Objective/Physical Exam Neurovascular status intact. Skin incisions appear to be well coapted. No sign of infectious process noted. No dehiscence. No active bleeding noted. Moderate edema noted to the surgical extremity.  Radiographic exam: Stable osteotomy sites with intact orthopedic hardware left foot  Assessment: 1. s/p correction of hallux varus and 2nd hammertoe of left foot. DOS: 02/24/17.   Plan of Care:  1. Patient was evaluated. X-rays reviewed today 2. Full activity with no restrictions. 3. Recommended good shoe gear.  4. Return to clinic when necessary.    Edrick Kins, DPM [facility]  Dr. Edrick Kins, [address]                                        [place], [place] [address]                Office [phone]  Fax [phone]

## 2017-05-26 ENCOUNTER — Telehealth: Payer: Self-pay

## 2017-05-26 ENCOUNTER — Other Ambulatory Visit: Payer: Self-pay | Admitting: Obstetrics and Gynecology

## 2017-05-26 DIAGNOSIS — Z1231 Encounter for screening mammogram for malignant neoplasm of breast: Secondary | ICD-10-CM

## 2017-05-26 NOTE — Telephone Encounter (Signed)
Order is in.

## 2017-05-26 NOTE — Telephone Encounter (Signed)
Spoke w/pt. Notified AMS out of office til Monday, but message has been received and someone will contact her w/apt details once scheduled.

## 2017-05-26 NOTE — Telephone Encounter (Signed)
Pt tried to schedule mammo w/[facility], but was directed to [place]. Pt would like it scheduled on same day as AE 08/01/17 w/[facility]. ZF#[phone].

## 2017-05-27 NOTE — Telephone Encounter (Signed)
Patient is aware of appointment at [facility] on 08/01/16 @ 1:40pm and appointment at [facility] w/ Dr. Georgianne Fick on 08/01/16 @ 2:30pm.

## 2017-07-04 DIAGNOSIS — M25561 Pain in right knee: Secondary | ICD-10-CM | POA: Diagnosis not present

## 2017-07-18 DIAGNOSIS — M25561 Pain in right knee: Secondary | ICD-10-CM | POA: Diagnosis not present

## 2017-07-20 DIAGNOSIS — M25561 Pain in right knee: Secondary | ICD-10-CM | POA: Diagnosis not present

## 2017-07-28 ENCOUNTER — Encounter: Payer: Self-pay | Admitting: Family Medicine

## 2017-07-28 ENCOUNTER — Ambulatory Visit (INDEPENDENT_AMBULATORY_CARE_PROVIDER_SITE_OTHER): Payer: Managed Care, Other (non HMO) | Admitting: Family Medicine

## 2017-07-28 VITALS — BP 133/88 | HR 75 | Temp 97.6°F | Ht 68.25 in | Wt 171.5 lb

## 2017-07-28 DIAGNOSIS — Z7689 Persons encountering health services in other specified circumstances: Secondary | ICD-10-CM | POA: Diagnosis not present

## 2017-07-28 DIAGNOSIS — M25561 Pain in right knee: Secondary | ICD-10-CM

## 2017-07-28 DIAGNOSIS — G8929 Other chronic pain: Secondary | ICD-10-CM | POA: Diagnosis not present
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
## 2017-07-28 NOTE — Patient Instructions (Signed)
Follow up for CPE 

## 2017-08-01 ENCOUNTER — Ambulatory Visit (INDEPENDENT_AMBULATORY_CARE_PROVIDER_SITE_OTHER): Payer: Managed Care, Other (non HMO) | Admitting: Obstetrics and Gynecology

## 2017-08-01 ENCOUNTER — Ambulatory Visit
Admission: RE | Admit: 2017-08-01 | Discharge: 2017-08-01 | Disposition: A | Discharge status: Home / Self Care | Payer: Managed Care, Other (non HMO) | Source: Ambulatory Visit | Attending: Obstetrics and Gynecology | Admitting: Obstetrics and Gynecology

## 2017-08-01 ENCOUNTER — Encounter: Payer: Self-pay | Admitting: Obstetrics and Gynecology

## 2017-08-01 DIAGNOSIS — Z1239 Encounter for other screening for malignant neoplasm of breast: Secondary | ICD-10-CM

## 2017-08-01 DIAGNOSIS — Z1231 Encounter for screening mammogram for malignant neoplasm of breast: Secondary | ICD-10-CM | POA: Diagnosis not present

## 2017-08-01 DIAGNOSIS — Z01419 Encounter for gynecological examination (general) (routine) without abnormal findings: Secondary | ICD-10-CM | POA: Diagnosis not present

## 2017-08-01 NOTE — Progress Notes (Signed)
Gynecology Annual Exam  PCP: Volney American, PA-C  Chief Complaint:  Chief Complaint  Patient presents with  . Gynecologic Exam    sporadic sharpe pain in hip area    History of Present Illness:Patient is a 52 y.o.  presents for annual exam. The patient has no complaints today.   LMP: No LMP recorded. Patient has had an ablation.  The patient is sexually active. She denies dyspareunia.  The patient does perform self breast exams.  There is no notable family history of breast or ovarian cancer in her family.  The patient wears seatbelts: yes.   The patient has regular exercise: not asked.    The patient denies current symptoms of depression.     Review of Systems: Review of Systems  Constitutional: Negative for chills and fever.  HENT: Negative for congestion.   Respiratory: Negative for cough and shortness of breath.   Cardiovascular: Negative for chest pain and palpitations.  Gastrointestinal: Negative for abdominal pain, constipation, diarrhea, heartburn, nausea and vomiting.  Genitourinary: Negative for dysuria, frequency and urgency.  Skin: Negative for itching and rash.  Neurological: Negative for dizziness and headaches.  Endo/Heme/Allergies: Negative for polydipsia.  Psychiatric/Behavioral: Negative for depression.    Past Medical History:  Past Medical History:  Diagnosis Date  . Stress     Past Surgical History:  Past Surgical History:  Procedure Laterality Date  . CESAREAN SECTION    . FOOT SURGERY    . HALLUX VALGUS CORRECTION    . TOE SURGERY    . TONSILLECTOMY AND ADENOIDECTOMY      Gynecologic History:  No LMP recorded. Patient has had an ablation. Last Pap: Results were: 07/24/2016 NIL (plain pap)  Last mammogram:07/28/2016  Results were: BI-RAD I   Obstetric History: No obstetric history on file.  Family History:  Family History  Problem Relation Age of Onset  . Stroke Mother   . Hypertension Mother   . Cancer Mother   .  Diabetes Father     Social History:  Social History   Socioeconomic History  . Marital status: Married    Spouse name: Not on file  . Number of children: Not on file  . Years of education: Not on file  . Highest education level: Not on file  Social Needs  . Financial resource strain: Not on file  . Food insecurity - worry: Not on file  . Food insecurity - inability: Not on file  . Transportation needs - medical: Not on file  . Transportation needs - non-medical: Not on file  Occupational History  . Not on file  Tobacco Use  . Smoking status: Former Research scientist (life sciences)  . Smokeless tobacco: Never Used  Substance and Sexual Activity  . Alcohol use: No    Alcohol/week: 0.0 oz  . Drug use: No  . Sexual activity: Yes  Other Topics Concern  . Not on file  Social History Narrative   Married   2 daughters- ages 30 and 48   She works as a Art therapist, husband works for [employer]:  No Known Allergies  Medications: Prior to Admission medications   Medication Sig Start Date End Date Taking? Authorizing Provider  ibuprofen (ADVIL,MOTRIN) 800 MG tablet TAKE 1 TABLET (800 MG TOTAL) BY MOUTH EVERY 8 (EIGHT) HOURS AS NEEDED. 05/28/15   [provider]    Physical Exam Vitals: There were no vitals taken for this visit.  General: NAD HEENT: normocephalic, anicteric Thyroid: no enlargement,  no palpable nodules Pulmonary: No increased work of breathing, CTAB Cardiovascular: RRR, distal pulses 2+ Breast: Breast symmetrical, no tenderness, no palpable nodules or masses, no skin or nipple retraction present, no nipple discharge.  No axillary or supraclavicular lymphadenopathy. Abdomen: NABS, soft, non-tender, non-distended.  Umbilicus without lesions.  No hepatomegaly, splenomegaly or masses palpable. No evidence of hernia  Genitourinary:  External: Normal external female genitalia.  Normal urethral meatus, normal  Bartholin's and Skene's glands.    Vagina: Normal vaginal  mucosa, no evidence of prolapse.    Cervix: Grossly normal in appearance, no bleeding  Uterus: Non-enlarged, mobile, normal contour.  No CMT  Adnexa: ovaries non-enlarged, no adnexal masses  Rectal: deferred  Lymphatic: no evidence of inguinal lymphadenopathy Extremities: no edema, erythema, or tenderness Neurologic: Grossly intact Psychiatric: mood appropriate, affect full  Female chaperone present for pelvic and breast  portions of the physical exam     Assessment: 52 y.o. No obstetric history on file. routine annual exam  Plan: Problem List Items Addressed This Visit    None    Visit Diagnoses    Breast screening       Encounter for gynecological examination without abnormal finding          1) Mammogram - recommend yearly screening mammogram.  Mammogram Was ordered today - done today pending  2) STI screening was not offered  3) ASCCP guidelines and rational discussed.  Patient opts for every 3 years screening interval  4) Osteoporosis  - per USPTF routine screening DEXA at age 55 - maternal history of hip fracture  5) Routine healthcare maintenance including cholesterol, diabetes screening discussed managed by PCP  6) Colonoscopy up to date 08/06/2013 at age 10  7) Follow up 1 year for routine annual   08/29/09 endometrial ablation ([facility])

## 2017-08-01 NOTE — Patient Instructions (Signed)
Preventive Care 40-64 Years, Female Preventive care refers to lifestyle choices and visits with your health care provider that can promote health and wellness. What does preventive care include?  A yearly physical exam. This is also called an annual well check.  Dental exams once or twice a year.  Routine eye exams. Ask your health care provider how often you should have your eyes checked.  Personal lifestyle choices, including: ? Daily care of your teeth and gums. ? Regular physical activity. ? Eating a healthy diet. ? Avoiding tobacco and drug use. ? Limiting alcohol use. ? Practicing safe sex. ? Taking low-dose aspirin daily starting at age 58. ? Taking vitamin and mineral supplements as recommended by your health care provider. What happens during an annual well check? The services and screenings done by your health care provider during your annual well check will depend on your age, overall health, lifestyle risk factors, and family history of disease. Counseling Your health care provider may ask you questions about your:  Alcohol use.  Tobacco use.  Drug use.  Emotional well-being.  Home and relationship well-being.  Sexual activity.  Eating habits.  Work and work Statistician.  Method of birth control.  Menstrual cycle.  Pregnancy history.  Screening You may have the following tests or measurements:  Height, weight, and BMI.  Blood pressure.  Lipid and cholesterol levels. These may be checked every 5 years, or more frequently if you are over 81 years old.  Skin check.  Lung cancer screening. You may have this screening every year starting at age 78 if you have a 30-pack-year history of smoking and currently smoke or have quit within the past 15 years.  Fecal occult blood test (FOBT) of the stool. You may have this test every year starting at age 65.  Flexible sigmoidoscopy or colonoscopy. You may have a sigmoidoscopy every 5 years or a colonoscopy  every 10 years starting at age 30.  Hepatitis C blood test.  Hepatitis B blood test.  Sexually transmitted disease (STD) testing.  Diabetes screening. This is done by checking your blood sugar (glucose) after you have not eaten for a while (fasting). You may have this done every 1-3 years.  Mammogram. This may be done every 1-2 years. Talk to your health care provider about when you should start having regular mammograms. This may depend on whether you have a family history of breast cancer.  BRCA-related cancer screening. This may be done if you have a family history of breast, ovarian, tubal, or peritoneal cancers.  Pelvic exam and Pap test. This may be done every 3 years starting at age 80. Starting at age 36, this may be done every 5 years if you have a Pap test in combination with an HPV test.  Bone density scan. This is done to screen for osteoporosis. You may have this scan if you are at high risk for osteoporosis.  Discuss your test results, treatment options, and if necessary, the need for more tests with your health care provider. Vaccines Your health care provider may recommend certain vaccines, such as:  Influenza vaccine. This is recommended every year.  Tetanus, diphtheria, and acellular pertussis (Tdap, Td) vaccine. You may need a Td booster every 10 years.  Varicella vaccine. You may need this if you have not been vaccinated.  Zoster vaccine. You may need this after age 5.  Measles, mumps, and rubella (MMR) vaccine. You may need at least one dose of MMR if you were born in  1957 or later. You may also need a second dose.  Pneumococcal 13-valent conjugate (PCV13) vaccine. You may need this if you have certain conditions and were not previously vaccinated.  Pneumococcal polysaccharide (PPSV23) vaccine. You may need one or two doses if you smoke cigarettes or if you have certain conditions.  Meningococcal vaccine. You may need this if you have certain  conditions.  Hepatitis A vaccine. You may need this if you have certain conditions or if you travel or work in places where you may be exposed to hepatitis A.  Hepatitis B vaccine. You may need this if you have certain conditions or if you travel or work in places where you may be exposed to hepatitis B.  Haemophilus influenzae type b (Hib) vaccine. You may need this if you have certain conditions.  Talk to your health care provider about which screenings and vaccines you need and how often you need them. This information is not intended to replace advice given to you by your health care provider. Make sure you discuss any questions you have with your health care provider. Document Released: 07/11/2015 Document Revised: 03/03/2016 Document Reviewed: 04/15/2015 Elsevier Interactive Patient Education  2018 Elsevier Inc.  

## 2017-08-05 ENCOUNTER — Encounter: Payer: Self-pay | Admitting: Family Medicine

## 2017-08-05 ENCOUNTER — Ambulatory Visit (INDEPENDENT_AMBULATORY_CARE_PROVIDER_SITE_OTHER): Payer: Managed Care, Other (non HMO) | Admitting: Family Medicine

## 2017-08-05 VITALS — BP 128/88 | HR 70 | Temp 98.2°F | Ht 65.0 in | Wt 171.0 lb

## 2017-08-05 DIAGNOSIS — Z23 Encounter for immunization: Secondary | ICD-10-CM | POA: Diagnosis not present

## 2017-08-05 DIAGNOSIS — Z0000 Encounter for general adult medical examination without abnormal findings: Secondary | ICD-10-CM | POA: Diagnosis not present

## 2017-08-05 MED ORDER — IBUPROFEN 800 MG PO TABS: 800.0000 mg | ORAL_TABLET | Freq: Three times a day (TID) | ORAL | 11 refills | Status: DC | PRN

## 2017-08-05 NOTE — Progress Notes (Signed)
BP 128/88   Pulse 70   Temp 98.2 F (36.8 C) (Oral)   Ht 5\' 5"  (1.651 m)   Wt 171 lb (77.6 kg)   SpO2 99%   BMI 28.46 kg/m    Subjective:    Patient ID: Tiffany Mata, female    DOB: 04-Jun-1966, 52 y.o.   MRN: [id number]  HPI: Tiffany Mata is a 52 y.o. female presenting on 08/05/2017 for comprehensive medical examination. Current medical complaints include:none  Fasting for labs today. Wanting a printed letter of her results for insurance purposes.   She currently lives with: husband, daughter Menopausal Symptoms: no  Depression Screen done today and results listed below:  Depression screen [place] 2/9 08/05/2017  Decreased Interest 0  Down, Depressed, Hopeless 0  PHQ - 2 Score 0  Altered sleeping 0  Tired, decreased energy 0  Change in appetite 0  Feeling bad or failure about yourself  0  Trouble concentrating 0  Moving slowly or fidgety/restless 0  Suicidal thoughts 0  PHQ-9 Score 0    The patient does not have a history of falls. I did not complete a risk assessment for falls. A plan of care for falls was not documented.   Past Medical History:  Past Medical History:  Diagnosis Date  . Stress     Surgical History:  Past Surgical History:  Procedure Laterality Date  . CESAREAN SECTION    . FOOT SURGERY    . HALLUX VALGUS CORRECTION    . TOE SURGERY    . TONSILLECTOMY AND ADENOIDECTOMY      Medications:  No current outpatient medications on file prior to visit.   No current facility-administered medications on file prior to visit.     Allergies:  No Known Allergies  Social History:  Social History   Socioeconomic History  . Marital status: Married    Spouse name: Not on file  . Number of children: Not on file  . Years of education: Not on file  . Highest education level: Not on file  Social Needs  . Financial resource strain: Not on file  . Food insecurity - worry: Not on file  . Food insecurity - inability: Not on file  . Transportation needs -  medical: Not on file  . Transportation needs - non-medical: Not on file  Occupational History  . Not on file  Tobacco Use  . Smoking status: Former Research scientist (life sciences)  . Smokeless tobacco: Never Used  Substance and Sexual Activity  . Alcohol use: No    Alcohol/week: 0.0 oz  . Drug use: No  . Sexual activity: Yes  Other Topics Concern  . Not on file  Social History Narrative   Married   2 daughters- ages 65 and 20   She works as a Art therapist, husband works for [employer]   Tobacco Use  Smoking Status Former Smoker  Smokeless Tobacco Never Used   Social History   Substance and Sexual Activity  Alcohol Use No  . Alcohol/week: 0.0 oz    Family History:  Family History  Problem Relation Age of Onset  . Stroke Mother   . Hypertension Mother   . Cancer Mother   . Diabetes Father     Past medical history, surgical history, medications, allergies, family history and social history reviewed with patient today and changes made to appropriate areas of the chart.   Review of Systems - General ROS: negative Psychological ROS: negative Ophthalmic ROS: negative ENT  ROS: negative Allergy and Immunology ROS: negative Hematological and Lymphatic ROS: negative Breast ROS: negative for breast lumps Respiratory ROS: no cough, shortness of breath, or wheezing Cardiovascular ROS: no chest pain or dyspnea on exertion Gastrointestinal ROS: no abdominal pain, change in bowel habits, or black or bloody stools Genito-Urinary ROS: no dysuria, trouble voiding, or hematuria Musculoskeletal ROS: negative Neurological ROS: no TIA or stroke symptoms Dermatological ROS: negative All other ROS negative except what is listed above and in the HPI.      Objective:    BP 128/88   Pulse 70   Temp 98.2 F (36.8 C) (Oral)   Ht 5\' 5"  (1.651 m)   Wt 171 lb (77.6 kg)   SpO2 99%   BMI 28.46 kg/m   Wt Readings from Last 3 Encounters:  08/05/17 171 lb (77.6 kg)  08/01/17 175 lb (79.4 kg)    07/28/17 171 lb 8 oz (77.8 kg)    Physical Exam  Constitutional: She is oriented to person, place, and time. She appears well-developed and well-nourished. No distress.  HENT:  Head: Atraumatic.  Right Ear: External ear normal.  Left Ear: External ear normal.  Nose: Nose normal.  Mouth/Throat: Oropharynx is clear and moist. No oropharyngeal exudate.  Eyes: Conjunctivae are normal. Pupils are equal, round, and reactive to light. No scleral icterus.  Neck: Normal range of motion. Neck supple. No thyromegaly present.  Cardiovascular: Normal rate, regular rhythm, normal heart sounds and intact distal pulses.  Pulmonary/Chest: Effort normal and breath sounds normal. No respiratory distress.  Abdominal: Soft. Bowel sounds are normal. She exhibits no mass. There is no tenderness.  Musculoskeletal: Normal range of motion. She exhibits no edema or tenderness.  Lymphadenopathy:    She has no cervical adenopathy.  Neurological: She is alert and oriented to person, place, and time. No cranial nerve deficit.  Skin: Skin is warm and dry. No rash noted.  Psychiatric: She has a normal mood and affect. Her behavior is normal.  Nursing note and vitals reviewed.   Results for orders placed or performed in visit on 08/05/17  CBC with Differential/Platelet  Result Value Ref Range   WBC 5.4 3.4 - 10.8 x10E3/uL   RBC 4.83 3.77 - 5.28 x10E6/uL   Hemoglobin 14.5 11.1 - 15.9 g/dL   Hematocrit 43.7 34.0 - 46.6 %   MCV 91 79 - 97 fL   MCH 30.0 26.6 - 33.0 pg   MCHC 33.2 31.5 - 35.7 g/dL   RDW 14.0 12.3 - 15.4 %   Platelets 222 150 - 379 x10E3/uL   Neutrophils 61 Not Estab. %   Lymphs 29 Not Estab. %   Monocytes 9 Not Estab. %   Eos 1 Not Estab. %   Basos 0 Not Estab. %   Neutrophils Absolute 3.2 1.4 - 7.0 x10E3/uL   Lymphocytes Absolute 1.6 0.7 - 3.1 x10E3/uL   Monocytes Absolute 0.5 0.1 - 0.9 x10E3/uL   EOS (ABSOLUTE) 0.0 0.0 - 0.4 x10E3/uL   Basophils Absolute 0.0 0.0 - 0.2 x10E3/uL   Immature  Granulocytes 0 Not Estab. %   Immature Grans (Abs) 0.0 0.0 - 0.1 x10E3/uL  Comprehensive metabolic panel  Result Value Ref Range   Glucose 86 65 - 99 mg/dL   BUN 11 6 - 24 mg/dL   Creatinine, Ser 0.84 0.57 - 1.00 mg/dL   GFR calc non Af Amer 80 >59 mL/min/1.73   GFR calc Af Amer 92 >59 mL/min/1.73   BUN/Creatinine Ratio 13 9 - 23  Sodium 140 134 - 144 mmol/L   Potassium 4.2 3.5 - 5.2 mmol/L   Chloride 103 96 - 106 mmol/L   CO2 22 20 - 29 mmol/L   Calcium 9.4 8.7 - 10.2 mg/dL   Total Protein 6.6 6.0 - 8.5 g/dL   Albumin 4.3 3.5 - 5.5 g/dL   Globulin, Total 2.3 1.5 - 4.5 g/dL   Albumin/Globulin Ratio 1.9 1.2 - 2.2   Bilirubin Total 0.4 0.0 - 1.2 mg/dL   Alkaline Phosphatase 93 39 - 117 IU/L   AST 24 0 - 40 IU/L   ALT 28 0 - 32 IU/L  Lipid Panel w/o Chol/HDL Ratio  Result Value Ref Range   Cholesterol, Total 187 100 - 199 mg/dL   Triglycerides 71 0 - 149 mg/dL   HDL 42 >39 mg/dL   VLDL Cholesterol Cal 14 5 - 40 mg/dL   LDL Calculated 131 (H) 0 - 99 mg/dL  TSH  Result Value Ref Range   TSH 0.517 0.450 - 4.500 uIU/mL      Assessment & Plan:   Problem List Items Addressed This Visit    None    Visit Diagnoses    Annual physical exam    -  Primary   Fasting labs drawn today. No concerns. F/u as needed or in 1 year for CPE   Relevant Orders   CBC with Differential/Platelet (Completed)   Comprehensive metabolic panel (Completed)   Lipid Panel w/o Chol/HDL Ratio (Completed)   TSH (Completed)   UA/M w/rflx Culture, Routine   Need for Tdap vaccination       Relevant Orders   Td : Tetanus/diphtheria >7yo Preservative  free (Completed)       Follow up plan: Return in about 1 year (around 08/05/2018) for CPE.   LABORATORY TESTING:  - Pap smear: up to date, managed by GYN  IMMUNIZATIONS:   - Tdap: Tetanus vaccination status reviewed: Td vaccination indicated and given today. - Influenza: Up to date  SCREENING: -Mammogram: Up to date , performed at GYN physical  08/01/17 - Colonoscopy: Up to date   PATIENT COUNSELING:   Advised to take 1 mg of folate supplement per day if capable of pregnancy.   Sexuality: Discussed sexually transmitted diseases, partner selection, use of condoms, avoidance of unintended pregnancy  and contraceptive alternatives.   Advised to avoid cigarette smoking.  I discussed with the patient that most people either abstain from alcohol or drink within safe limits (<=14/week and <=4 drinks/occasion for males, <=7/weeks and <= 3 drinks/occasion for females) and that the risk for alcohol disorders and other health effects rises proportionally with the number of drinks per week and how often a drinker exceeds daily limits.  Discussed cessation/primary prevention of drug use and availability of treatment for abuse.   Diet: Encouraged to adjust caloric intake to maintain  or achieve ideal body weight, to reduce intake of dietary saturated fat and total fat, to limit sodium intake by avoiding high sodium foods and not adding table salt, and to maintain adequate dietary potassium and calcium preferably from fresh fruits, vegetables, and low-fat dairy products.    stressed the importance of regular exercise  Injury prevention: Discussed safety belts, safety helmets, smoke detector, smoking near bedding or upholstery.   Dental health: Discussed importance of regular tooth brushing, flossing, and dental visits.    NEXT PREVENTATIVE PHYSICAL DUE IN 1 YEAR. Return in about 1 year (around 08/05/2018) for CPE.

## 2017-08-06 LAB — COMPREHENSIVE METABOLIC PANEL
A/G RATIO: 1.9 (ref 1.2–2.2)
ALK PHOS: 93 IU/L (ref 39–117)
ALT: 28 IU/L (ref 0–32)
AST: 24 IU/L (ref 0–40)
Albumin: 4.3 g/dL (ref 3.5–5.5)
BILIRUBIN TOTAL: 0.4 mg/dL (ref 0.0–1.2)
BUN/Creatinine Ratio: 13 (ref 9–23)
BUN: 11 mg/dL (ref 6–24)
CHLORIDE: 103 mmol/L (ref 96–106)
CO2: 22 mmol/L (ref 20–29)
Calcium: 9.4 mg/dL (ref 8.7–10.2)
Creatinine, Ser: 0.84 mg/dL (ref 0.57–1.00)
GFR calc Af Amer: 92 mL/min/{1.73_m2} (ref >59)
GFR, EST NON AFRICAN AMERICAN: 80 mL/min/{1.73_m2} (ref >59)
GLOBULIN, TOTAL: 2.3 g/dL (ref 1.5–4.5)
Glucose: 86 mg/dL (ref 65–99)
Potassium: 4.2 mmol/L (ref 3.5–5.2)
SODIUM: 140 mmol/L (ref 134–144)
Total Protein: 6.6 g/dL (ref 6.0–8.5)

## 2017-08-06 LAB — LIPID PANEL W/O CHOL/HDL RATIO
CHOLESTEROL TOTAL: 187 mg/dL (ref 100–199)
HDL: 42 mg/dL (ref >39)
LDL Calculated: 131 mg/dL — ABNORMAL HIGH (ref 0–99)
TRIGLYCERIDES: 71 mg/dL (ref 0–149)
VLDL Cholesterol Cal: 14 mg/dL (ref 5–40)

## 2017-08-06 LAB — CBC WITH DIFFERENTIAL/PLATELET
BASOS ABS: 0 10*3/uL (ref 0.0–0.2)
Basos: 0 %
EOS (ABSOLUTE): 0 10*3/uL (ref 0.0–0.4)
Eos: 1 %
HEMATOCRIT: 43.7 % (ref 34.0–46.6)
Hemoglobin: 14.5 g/dL (ref 11.1–15.9)
Immature Grans (Abs): 0 10*3/uL (ref 0.0–0.1)
Immature Granulocytes: 0 %
LYMPHS ABS: 1.6 10*3/uL (ref 0.7–3.1)
Lymphs: 29 %
MCH: 30 pg (ref 26.6–33.0)
MCHC: 33.2 g/dL (ref 31.5–35.7)
MCV: 91 fL (ref 79–97)
MONOS ABS: 0.5 10*3/uL (ref 0.1–0.9)
Monocytes: 9 %
Neutrophils Absolute: 3.2 10*3/uL (ref 1.4–7.0)
Neutrophils: 61 %
Platelets: 222 10*3/uL (ref 150–379)
RBC: 4.83 x10E6/uL (ref 3.77–5.28)
RDW: 14 % (ref 12.3–15.4)
WBC: 5.4 10*3/uL (ref 3.4–10.8)

## 2017-08-06 LAB — TSH: TSH: 0.517 u[IU]/mL (ref 0.450–4.500)

## 2017-08-07 NOTE — Patient Instructions (Signed)
Follow up in 1 year.

## 2017-08-08 ENCOUNTER — Encounter: Payer: Self-pay | Admitting: Family Medicine

## 2018-01-08 ENCOUNTER — Other Ambulatory Visit: Payer: Self-pay

## 2018-01-08 ENCOUNTER — Emergency Department
Admission: EM | Admit: 2018-01-08 | Discharge: 2018-01-08 | Disposition: A | Discharge status: Home / Self Care | Payer: Managed Care, Other (non HMO) | Attending: Emergency Medicine | Admitting: Emergency Medicine

## 2018-01-08 ENCOUNTER — Emergency Department: Payer: Managed Care, Other (non HMO)

## 2018-01-08 DIAGNOSIS — N3090 Cystitis, unspecified without hematuria: Secondary | ICD-10-CM | POA: Diagnosis not present

## 2018-01-08 DIAGNOSIS — N281 Cyst of kidney, acquired: Secondary | ICD-10-CM | POA: Diagnosis not present

## 2018-01-08 DIAGNOSIS — R109 Unspecified abdominal pain: Secondary | ICD-10-CM | POA: Diagnosis not present

## 2018-01-08 DIAGNOSIS — N3091 Cystitis, unspecified with hematuria: Secondary | ICD-10-CM | POA: Insufficient documentation

## 2018-01-08 DIAGNOSIS — Z87891 Personal history of nicotine dependence: Secondary | ICD-10-CM | POA: Diagnosis not present

## 2018-01-08 DIAGNOSIS — R319 Hematuria, unspecified: Secondary | ICD-10-CM | POA: Diagnosis not present

## 2018-01-08 LAB — BASIC METABOLIC PANEL
Anion gap: 7 (ref 5–15)
BUN: 15 mg/dL (ref 6–20)
CALCIUM: 9.1 mg/dL (ref 8.9–10.3)
CO2: 27 mmol/L (ref 22–32)
CREATININE: 0.91 mg/dL (ref 0.44–1.00)
Chloride: 105 mmol/L (ref 98–111)
GFR calc Af Amer: >60 mL/min (ref >60)
GFR calc non Af Amer: >60 mL/min (ref >60)
Glucose, Bld: 112 mg/dL — ABNORMAL HIGH (ref 70–99)
Potassium: 3.6 mmol/L (ref 3.5–5.1)
Sodium: 139 mmol/L (ref 135–145)

## 2018-01-08 LAB — URINALYSIS, COMPLETE (UACMP) WITH MICROSCOPIC
Bacteria, UA: NONE SEEN
RBC / HPF: >50 RBC/hpf — ABNORMAL HIGH (ref 0–5)
SPECIFIC GRAVITY, URINE: 1.023 (ref 1.005–1.030)
SQUAMOUS EPITHELIAL / LPF: NONE SEEN (ref 0–5)
WBC, UA: >50 WBC/hpf — ABNORMAL HIGH (ref 0–5)

## 2018-01-08 LAB — CBC
HCT: 43.1 % (ref 35.0–47.0)
Hemoglobin: 15 g/dL (ref 12.0–16.0)
MCH: 30.3 pg (ref 26.0–34.0)
MCHC: 34.8 g/dL (ref 32.0–36.0)
MCV: 87.2 fL (ref 80.0–100.0)
Platelets: 227 10*3/uL (ref 150–440)
RBC: 4.95 MIL/uL (ref 3.80–5.20)
RDW: 13.1 % (ref 11.5–14.5)
WBC: 15 10*3/uL — ABNORMAL HIGH (ref 3.6–11.0)

## 2018-01-08 MED ORDER — CEPHALEXIN 500 MG PO CAPS: 500.0000 mg | ORAL_CAPSULE | Freq: Four times a day (QID) | ORAL | 0 refills | Status: AC

## 2018-01-08 MED ORDER — PHENAZOPYRIDINE HCL 200 MG PO TABS: 200.0000 mg | ORAL_TABLET | Freq: Three times a day (TID) | ORAL | 0 refills | Status: DC | PRN

## 2018-01-08 NOTE — Discharge Instructions (Addendum)
To the emergency room for any new or worsening symptoms including increased pain, fever, vomiting, follow closely with your primary care doctor and urology,

## 2018-01-08 NOTE — ED Triage Notes (Signed)
Pt arrives with hematuria that began this morning. States clots present. Husband states a week ago R sided back pain. Denies hx of kidney stones. Has had UTI before. Alert, oriented, ambulatory.

## 2018-01-08 NOTE — ED Provider Notes (Signed)
[facility] Emergency Department Provider Note  ____________________________________________   I have reviewed the triage vital signs and the nursing notes. Where available I have reviewed prior notes and, if possible and indicated, outside hospital notes.    HISTORY  Chief Complaint Hematuria    HPI Tiffany Mata is a 52 y.o. female  History of uterine ablation in the past, desiring tobacco abuse, states that for last week she is at low back pain, urinary frequency urinary hesitancy and now is having frank hematuria.  This is the first time she has had the symptoms although she has had UTIs in the past.  She does not have any significant pain right now.  She was sent here because of the fact that Medicare thought it was possibly a kidney stone causing her the symptoms.  She denies any fever chills or vomiting.  She has no abdominal pain.  She is certain this is coming from her urine.  She denies any vaginal bleeding.     Past Medical History:  Diagnosis Date  . Stress     Patient Active Problem List   Diagnosis Date Noted  . Grief 08/14/2015  . Insomnia 09/10/2014  . Stress due to family tension 09/10/2014  . Adjustment disorder with mixed anxiety and depressed mood 09/10/2014  . Tobacco abuse 09/10/2014    Past Surgical History:  Procedure Laterality Date  . CESAREAN SECTION    . FOOT SURGERY    . HALLUX VALGUS CORRECTION    . TOE SURGERY    . TONSILLECTOMY AND ADENOIDECTOMY      Prior to Admission medications   Medication Sig Start Date End Date Taking? Authorizing Provider  ibuprofen (ADVIL,MOTRIN) 800 MG tablet Take 1 tablet (800 mg total) by mouth every 8 (eight) hours as needed. 08/05/17   Volney American, PA-C    Allergies Patient has no known allergies.  Family History  Problem Relation Age of Onset  . Stroke Mother   . Hypertension Mother   . Cancer Mother   . Diabetes Father     Social History Social History    Tobacco Use  . Smoking status: Former Research scientist (life sciences)  . Smokeless tobacco: Never Used  Substance Use Topics  . Alcohol use: No    Alcohol/week: 0.0 oz  . Drug use: No    Review of Systems Constitutional: No fever/chills Eyes: No visual changes. ENT: No sore throat. No stiff neck no neck pain Cardiovascular: Denies chest pain. Respiratory: Denies shortness of breath. Gastrointestinal:   no vomiting.  No diarrhea.  No constipation. Genitourinary: See HPI, positive for dysuria. Musculoskeletal: Negative lower extremity swelling Skin: Negative for rash. Neurological: Negative for severe headaches, focal weakness or numbness.   ____________________________________________   PHYSICAL EXAM:  VITAL SIGNS: ED Triage Vitals [01/08/18 1053]  Enc Vitals Group     BP      Pulse      Resp      Temp      Temp src      SpO2      Weight 159 lb (72.1 kg)     Height 5\' 8"  (1.727 m)     Head Circumference      Peak Flow      Pain Score 9     Pain Loc      Pain Edu?      Excl. in [place]?     Constitutional: Alert and oriented. Well appearing and in no acute distress. Eyes: Conjunctivae are normal  Head: Atraumatic HEENT: No congestion/rhinnorhea. Mucous membranes are moist.  Oropharynx non-erythematous Neck:   Nontender with no meningismus, no masses, no stridor Cardiovascular: Normal rate, regular rhythm. Grossly normal heart sounds.  Good peripheral circulation. Respiratory: Normal respiratory effort.  No retractions. Lungs CTAB. Abdominal: Soft and nontender. No distention. No guarding no rebound Back:  There is no focal tenderness or step off.  there is no midline tenderness there are no lesions noted. there is no CVA tenderness Musculoskeletal: No lower extremity tenderness, no upper extremity tenderness. No joint effusions, no DVT signs strong distal pulses no edema Neurologic:  Normal speech and language. No gross focal neurologic deficits are appreciated.  Skin:  Skin is warm, dry  and intact. No rash noted. Psychiatric: Mood and affect are normal. Speech and behavior are normal.  ____________________________________________   LABS (all labs ordered are listed, but only abnormal results are displayed)  Labs Reviewed  URINALYSIS, COMPLETE (UACMP) WITH MICROSCOPIC - Abnormal; Notable for the following components:      Result Value   Color, Urine RED (*)    APPearance CLOUDY (*)    Glucose, UA   (*)    Value: TEST NOT REPORTED DUE TO COLOR INTERFERENCE OF URINE PIGMENT   Hgb urine dipstick   (*)    Value: TEST NOT REPORTED DUE TO COLOR INTERFERENCE OF URINE PIGMENT   Bilirubin Urine   (*)    Value: TEST NOT REPORTED DUE TO COLOR INTERFERENCE OF URINE PIGMENT   Ketones, ur   (*)    Value: TEST NOT REPORTED DUE TO COLOR INTERFERENCE OF URINE PIGMENT   Protein, ur   (*)    Value: TEST NOT REPORTED DUE TO COLOR INTERFERENCE OF URINE PIGMENT   Nitrite   (*)    Value: TEST NOT REPORTED DUE TO COLOR INTERFERENCE OF URINE PIGMENT   Leukocytes, UA   (*)    Value: TEST NOT REPORTED DUE TO COLOR INTERFERENCE OF URINE PIGMENT   RBC / HPF >50 (*)    WBC, UA >50 (*)    All other components within normal limits  BASIC METABOLIC PANEL - Abnormal; Notable for the following components:   Glucose, Bld 112 (*)    All other components within normal limits  CBC - Abnormal; Notable for the following components:   WBC 15.0 (*)    All other components within normal limits  URINE CULTURE    Pertinent labs  results that were available during my care of the patient were reviewed by me and considered in my medical decision making (see chart for details). ____________________________________________  EKG  I personally interpreted any EKGs ordered by me or triage  ____________________________________________  RADIOLOGY  Pertinent labs & imaging results that were available during my care of the patient were reviewed by me and considered in my medical decision making (see chart  for details). If possible, patient and/or family made aware of any abnormal findings.  No results found. ____________________________________________    PROCEDURES  Procedure(s) performed: None  Procedures  Critical Care performed: None  ____________________________________________   INITIAL IMPRESSION / ASSESSMENT AND PLAN / ED COURSE  Pertinent labs & imaging results that were available during my care of the patient were reviewed by me and considered in my medical decision making (see chart for details).  Significant hematuria noted, most likely this is hemorrhagic cystitis, patient is concerned about the possibility of kidney stone, she has been having recurrent back pain but no flank pain, low suspicion but we  will obtain imaging if that is why she was sent here apparently from urgent care.  CT is negative for stone my hope is that we can treat her UTI.  She is nontoxic in appearance, kidney functions preserved, she is not on any blood thinners, no easy bleeding or bruising otherwise noted, platelet count is reassuring, we will hopefully be able to treat her urine with antibiotics.  Patient obviously does not want to be admitted to the hospital she states patient has had Proteus and E. coli and the past.  It appears that both were sensitive to ciprofloxacin as well as cefepime ceftriaxone and cefuroxime.  If this holds, Keflex might not be a bad medication for her.  Does not have an IV would prefer not to have one at this time.    ____________________________________________   FINAL CLINICAL IMPRESSION(S) / ED DIAGNOSES  Final diagnoses:  None      This chart was dictated using voice recognition software.  Despite best efforts to proofread,  errors can occur which can change meaning.      Schuyler Amor, MD 01/08/18 1351

## 2018-01-08 NOTE — ED Notes (Signed)
Pt to first nurse desk inquiring about wait time. Informed pt that we are waiting on rooms in the back to open. Pt in NAD at this time. Pt asked if her blood work was back. Pt informed that blood work is back and the doctor will review this with her when she is taken to a treatment room.

## 2018-01-08 NOTE — ED Notes (Signed)
Pt states that she has had burning with urination and lower abd pain since this am - she reports hematuria started this am too - she reports urinary frequency and urgency that also started this am

## 2018-01-10 LAB — URINE CULTURE: Culture: >=100000 — AB

## 2018-01-16 DIAGNOSIS — N281 Cyst of kidney, acquired: Secondary | ICD-10-CM | POA: Diagnosis not present

## 2018-01-16 DIAGNOSIS — R310 Gross hematuria: Secondary | ICD-10-CM | POA: Diagnosis not present

## 2018-01-23 DIAGNOSIS — R300 Dysuria: Secondary | ICD-10-CM | POA: Diagnosis not present

## 2018-01-23 DIAGNOSIS — R310 Gross hematuria: Secondary | ICD-10-CM | POA: Diagnosis not present

## 2018-01-23 DIAGNOSIS — R3129 Other microscopic hematuria: Secondary | ICD-10-CM | POA: Diagnosis not present

## 2018-03-06 DIAGNOSIS — N281 Cyst of kidney, acquired: Secondary | ICD-10-CM | POA: Diagnosis not present

## 2018-03-06 DIAGNOSIS — R310 Gross hematuria: Secondary | ICD-10-CM | POA: Diagnosis not present

## 2018-04-05 ENCOUNTER — Telehealth: Payer: Self-pay | Admitting: Family Medicine

## 2018-04-05 NOTE — Telephone Encounter (Signed)
Mammogram was done 07/2017 and WNL - she is not due.   Copied from [place]. Topic: Inquiry >> Apr 05, 2018 10:28 AM Rutherford Nail, NT wrote: Reason for CRM: Patient calling and is requesting to get a mammogram. Please advise.

## 2018-04-05 NOTE — Telephone Encounter (Signed)
Called pt and relayed message.

## 2018-04-24 DIAGNOSIS — S83241A Other tear of medial meniscus, current injury, right knee, initial encounter: Secondary | ICD-10-CM | POA: Diagnosis not present

## 2018-04-27 ENCOUNTER — Ambulatory Visit (INDEPENDENT_AMBULATORY_CARE_PROVIDER_SITE_OTHER): Payer: Managed Care, Other (non HMO) | Admitting: Family Medicine

## 2018-04-27 ENCOUNTER — Telehealth: Payer: Self-pay | Admitting: Family Medicine

## 2018-04-27 ENCOUNTER — Encounter: Payer: Self-pay | Admitting: Family Medicine

## 2018-04-27 ENCOUNTER — Other Ambulatory Visit: Payer: Self-pay

## 2018-04-27 VITALS — BP 135/90 | HR 81 | Temp 98.6°F | Ht 69.0 in | Wt 165.5 lb

## 2018-04-27 DIAGNOSIS — J029 Acute pharyngitis, unspecified: Secondary | ICD-10-CM

## 2018-04-27 DIAGNOSIS — H6983 Other specified disorders of Eustachian tube, bilateral: Secondary | ICD-10-CM

## 2018-04-27 MED ORDER — PREDNISONE 10 MG PO TABS
ORAL_TABLET | ORAL | 0 refills | Status: DC
Start: 2018-04-27 — End: 2018-06-13

## 2018-04-27 NOTE — Progress Notes (Signed)
   BP 135/90   Pulse 81   Temp 98.6 F (37 C) (Oral)   Ht 5\' 9"  (1.753 m)   Wt 165 lb 8 oz (75.1 kg)   SpO2 96%   BMI 24.44 kg/m    Subjective:    Patient ID: Tiffany Mata, female    DOB: 09/07/1965, 52 y.o.   MRN: [id number]  HPI: Tiffany Mata is a 52 y.o. female  Chief Complaint  Patient presents with  . Neck Pain    left side pain for about a week   . Ear Pain   Here today for left sided localized neck soreness, malaise, sore throat, and left ear pain and pressure x 1 week. Notes some post-nasal drainage intermittently. Denies fevers, chills, sweats, CP, SOB, cough. Has not been trying anything OTC other than occasional ibuprofen. No sick contacts.   Relevant past medical, surgical, family and social history reviewed and updated as indicated. Interim medical history since our last visit reviewed. Allergies and medications reviewed and updated.  Review of Systems  Per HPI unless specifically indicated above     Objective:    BP 135/90   Pulse 81   Temp 98.6 F (37 C) (Oral)   Ht 5\' 9"  (1.753 m)   Wt 165 lb 8 oz (75.1 kg)   SpO2 96%   BMI 24.44 kg/m   Wt Readings from Last 3 Encounters:  04/27/18 165 lb 8 oz (75.1 kg)  01/08/18 159 lb (72.1 kg)  08/05/17 171 lb (77.6 kg)    Physical Exam  Constitutional: She is oriented to person, place, and time. She appears well-developed and well-nourished. No distress.  HENT:  Head: Atraumatic.  Right Ear: External ear normal.  Left Ear: External ear normal.  B/l middle ear effusion Oropharynx erythematous posteriorly Nasal mucosa injected  Eyes: Pupils are equal, round, and reactive to light. Conjunctivae and EOM are normal.  Neck: Normal range of motion. Neck supple.  Cardiovascular: Normal rate, regular rhythm and normal heart sounds.  Pulmonary/Chest: Effort normal and breath sounds normal. She has no wheezes.  Musculoskeletal: Normal range of motion.  Lymphadenopathy:    She has cervical adenopathy (left,  focal).  Neurological: She is alert and oriented to person, place, and time.  Skin: Skin is warm and dry.  Psychiatric: She has a normal mood and affect. Her behavior is normal.  Nursing note and vitals reviewed.   Results for orders placed or performed in visit on 04/27/18  Rapid Strep Screen ([facility] ONLY)  Result Value Ref Range   Strep Gp A Ag, IA W/Reflex Negative Negative  Culture, Group A Strep  Result Value Ref Range   Strep A Culture WILL FOLLOW       Assessment & Plan:   Problem List Items Addressed This Visit    None    Visit Diagnoses    Sore throat    -  Primary   Rapid strep neg, suspect from viral or allergic cause. OTC pain relievers, throat sprays, and allergy regimen as reviewed   Relevant Orders   Rapid Strep Screen ([facility] ONLY) (Completed)   Dysfunction of both eustachian tubes       L > R, start prednisone taper, flonase, zyrtec, and prn sudafed. Can do heat to the sore areas of neck for comfort. Prn OTC pain relievers       Follow up plan: Return if symptoms worsen or fail to improve.

## 2018-04-27 NOTE — Telephone Encounter (Signed)
Pt returned call, discussed neg strep. Pt agreeable to prednisone taper, flonase, and antihistamine to help open up eustachian tubes.

## 2018-04-27 NOTE — Telephone Encounter (Signed)
Provider spoke with patient.  Routing to provider.

## 2018-04-27 NOTE — Telephone Encounter (Signed)
Called and left VM to call back about strep results. Negative strep, will discuss tx options upon return call

## 2018-04-30 NOTE — Patient Instructions (Signed)
Follow up as needed

## 2018-05-01 LAB — CULTURE, GROUP A STREP

## 2018-05-01 LAB — RAPID STREP SCREEN (MED CTR MEBANE ONLY): STREP GP A AG, IA W/REFLEX: NEGATIVE

## 2018-05-02 ENCOUNTER — Telehealth: Payer: Self-pay

## 2018-05-02 DIAGNOSIS — J029 Acute pharyngitis, unspecified: Secondary | ICD-10-CM

## 2018-05-19 ENCOUNTER — Other Ambulatory Visit: Payer: Self-pay | Admitting: Orthopedic Surgery

## 2018-06-06 ENCOUNTER — Encounter
Admission: RE | Admit: 2018-06-06 | Discharge: 2018-06-06 | Disposition: A | Discharge status: Home / Self Care | Payer: Managed Care, Other (non HMO) | Source: Ambulatory Visit | Attending: Orthopedic Surgery | Admitting: Orthopedic Surgery

## 2018-06-06 ENCOUNTER — Other Ambulatory Visit: Payer: Self-pay

## 2018-06-06 HISTORY — DX: Gastro-esophageal reflux disease without esophagitis: K21.9

## 2018-06-06 NOTE — Patient Instructions (Signed)
Your procedure is scheduled on: 06-13-18 TUESDAY Report to Same Day Surgery 2nd floor medical mall [facility] Entrance-take elevator on left to 2nd floor.  Check in with surgery information desk.) To find out your arrival time please call [phone] between 1PM - 3PM on 06-12-18 MONDAY  Remember: Instructions that are not followed completely may result in serious medical risk, up to and including death, or upon the discretion of your surgeon and anesthesiologist your surgery may need to be rescheduled.    _x___ 1. Do not eat food after midnight the night before your procedure. NO GUM OR CANDY AFTER MIDNIGHT.  You may drink clear liquids up to 2 hours before you are scheduled to arrive at the hospital for your procedure.  Do not drink clear liquids within 2 hours of your scheduled arrival to the hospital.  Clear liquids include  --Water or Apple juice without pulp  --Clear carbohydrate beverage such as ClearFast or Gatorade  --Black Coffee or Clear Tea (No milk, no creamers, do not add anything to the coffee or Tea   ____Ensure clear carbohydrate drink on the way to the hospital for bariatric patients  ____Ensure clear carbohydrate drink 3 hours before surgery for Dr Dwyane Luo patients if physician instructed.     __x__ 2. No Alcohol for 24 hours before or after surgery.   __x__3. No Smoking or e-cigarettes for 24 prior to surgery.  Do not use any chewable tobacco products for at least 6 hour prior to surgery   ____  4. Bring all medications with you on the day of surgery if instructed.    __x__ 5. Notify your doctor if there is any change in your medical condition     (cold, fever, infections).    x___6. On the morning of surgery brush your teeth with toothpaste and water.  You may rinse your mouth with mouth wash if you wish.  Do not swallow any toothpaste or mouthwash.   Do not wear jewelry, make-up, hairpins, clips or nail polish.  Do not wear lotions, powders, or perfumes.  You may wear deodorant.  Do not shave 48 hours prior to surgery. Men may shave face and neck.  Do not bring valuables to the hospital.    [facility] is not responsible for any belongings or valuables.               Contacts, dentures or bridgework may not be worn into surgery.  Leave your suitcase in the car. After surgery it may be brought to your room.  For patients admitted to the hospital, discharge time is determined by your treatment team.  _  Patients discharged the day of surgery will not be allowed to drive home.  You will need someone to drive you home and stay with you the night of your procedure.    Please read over the following fact sheets that you were given:   [facility] Preparing for Surgery   _x___ TAKE THE FOLLOWING MEDICATION THE MORNING OF SURGERY WITH A SMALL SIP OF WATER. These include:  1. ZANTAC  2. TAKE A ZANTAC THE NIGHT BEFORE SURGERY  3.  4.  5.  6.  ____Fleets enema or Magnesium Citrate as directed.   _x___ Use CHG Soap or sage wipes as directed on instruction sheet   ____ Use inhalers on the day of surgery and bring to hospital day of surgery  ____ Stop Metformin and Janumet 2 days prior to surgery.    ____ Take 1/2  of usual insulin dose the night before surgery and none on the morning surgery.   ____ Follow recommendations from Cardiologist, Pulmonologist or PCP regarding stopping Aspirin, Coumadin, Plavix ,Eliquis, Effient, or Pradaxa, and Pletal.  X____Stop Anti-inflammatories such as Advil, Aleve, Ibuprofen, Motrin, Naproxen, Naprosyn, Goodies powders or aspirin products NOW-OK to take Tylenol    ____ Stop supplements until after surgery.    ____ Bring C-Pap to the hospital.

## 2018-06-07 ENCOUNTER — Encounter
Admission: RE | Admit: 2018-06-07 | Discharge: 2018-06-07 | Disposition: A | Discharge status: Home / Self Care | Payer: Managed Care, Other (non HMO) | Source: Ambulatory Visit | Attending: Orthopedic Surgery | Admitting: Orthopedic Surgery

## 2018-06-07 DIAGNOSIS — K219 Gastro-esophageal reflux disease without esophagitis: Secondary | ICD-10-CM | POA: Diagnosis not present

## 2018-06-07 DIAGNOSIS — M1711 Unilateral primary osteoarthritis, right knee: Secondary | ICD-10-CM | POA: Diagnosis not present

## 2018-06-07 DIAGNOSIS — S83241A Other tear of medial meniscus, current injury, right knee, initial encounter: Secondary | ICD-10-CM | POA: Insufficient documentation

## 2018-06-07 DIAGNOSIS — Z791 Long term (current) use of non-steroidal anti-inflammatories (NSAID): Secondary | ICD-10-CM | POA: Diagnosis not present

## 2018-06-07 DIAGNOSIS — F17210 Nicotine dependence, cigarettes, uncomplicated: Secondary | ICD-10-CM | POA: Diagnosis not present

## 2018-06-07 DIAGNOSIS — Z01818 Encounter for other preprocedural examination: Secondary | ICD-10-CM

## 2018-06-07 DIAGNOSIS — X58XXXA Exposure to other specified factors, initial encounter: Secondary | ICD-10-CM

## 2018-06-07 LAB — BASIC METABOLIC PANEL
Anion gap: 8 (ref 5–15)
BUN: 15 mg/dL (ref 6–20)
CHLORIDE: 102 mmol/L (ref 98–111)
CO2: 25 mmol/L (ref 22–32)
CREATININE: 1.07 mg/dL — AB (ref 0.44–1.00)
Calcium: 8.6 mg/dL — ABNORMAL LOW (ref 8.9–10.3)
GFR calc Af Amer: >60 mL/min (ref >60)
GFR calc non Af Amer: 59 mL/min — ABNORMAL LOW (ref >60)
Glucose, Bld: 118 mg/dL — ABNORMAL HIGH (ref 70–99)
Potassium: 3.4 mmol/L — ABNORMAL LOW (ref 3.5–5.1)
Sodium: 135 mmol/L (ref 135–145)

## 2018-06-07 LAB — CBC WITH DIFFERENTIAL/PLATELET
Abs Immature Granulocytes: 0.02 10*3/uL (ref 0.00–0.07)
Basophils Absolute: 0 10*3/uL (ref 0.0–0.1)
Basophils Relative: 0 %
Eosinophils Absolute: 0.1 10*3/uL (ref 0.0–0.5)
Eosinophils Relative: 1 %
HCT: 43.6 % (ref 36.0–46.0)
Hemoglobin: 14.7 g/dL (ref 12.0–15.0)
Immature Granulocytes: 0 %
Lymphocytes Relative: 30 %
Lymphs Abs: 1.9 10*3/uL (ref 0.7–4.0)
MCH: 30.6 pg (ref 26.0–34.0)
MCHC: 33.7 g/dL (ref 30.0–36.0)
MCV: 90.8 fL (ref 80.0–100.0)
Monocytes Absolute: 0.5 10*3/uL (ref 0.1–1.0)
Monocytes Relative: 9 %
Neutro Abs: 3.7 10*3/uL (ref 1.7–7.7)
Neutrophils Relative %: 60 %
Platelets: 216 10*3/uL (ref 150–400)
RBC: 4.8 MIL/uL (ref 3.87–5.11)
RDW: 12.8 % (ref 11.5–15.5)
WBC: 6.2 10*3/uL (ref 4.0–10.5)
nRBC: 0 % (ref 0.0–0.2)

## 2018-06-07 LAB — PROTIME-INR
INR: 1.03
Prothrombin Time: 13.4 seconds (ref 11.4–15.2)

## 2018-06-07 LAB — APTT: aPTT: 34 seconds (ref 24–36)

## 2018-06-12 MED ORDER — CEFAZOLIN SODIUM-DEXTROSE 2-4 GM/100ML-% IV SOLN
2.0000 g | INTRAVENOUS | Status: AC
  Administered 2018-06-13: 2 g via INTRAVENOUS

## 2018-06-13 ENCOUNTER — Encounter: Payer: Self-pay | Admitting: *Deleted

## 2018-06-13 ENCOUNTER — Ambulatory Visit: Payer: Managed Care, Other (non HMO) | Admitting: Certified Registered"

## 2018-06-13 ENCOUNTER — Encounter
Admission: RE | Disposition: A | Discharge status: Home / Self Care | Payer: Self-pay | Source: Home / Self Care | Attending: Orthopedic Surgery

## 2018-06-13 ENCOUNTER — Ambulatory Visit
Admission: RE | Admit: 2018-06-13 | Discharge: 2018-06-13 | Disposition: A | Discharge status: Home / Self Care | Payer: Managed Care, Other (non HMO) | Attending: Orthopedic Surgery | Admitting: Orthopedic Surgery

## 2018-06-13 DIAGNOSIS — S83241A Other tear of medial meniscus, current injury, right knee, initial encounter: Secondary | ICD-10-CM | POA: Diagnosis not present

## 2018-06-13 DIAGNOSIS — X58XXXA Exposure to other specified factors, initial encounter: Secondary | ICD-10-CM | POA: Insufficient documentation

## 2018-06-13 DIAGNOSIS — M1711 Unilateral primary osteoarthritis, right knee: Secondary | ICD-10-CM | POA: Insufficient documentation

## 2018-06-13 DIAGNOSIS — S83231A Complex tear of medial meniscus, current injury, right knee, initial encounter: Secondary | ICD-10-CM | POA: Diagnosis not present

## 2018-06-13 DIAGNOSIS — M65861 Other synovitis and tenosynovitis, right lower leg: Secondary | ICD-10-CM | POA: Diagnosis not present

## 2018-06-13 DIAGNOSIS — K219 Gastro-esophageal reflux disease without esophagitis: Secondary | ICD-10-CM | POA: Insufficient documentation

## 2018-06-13 DIAGNOSIS — Z791 Long term (current) use of non-steroidal anti-inflammatories (NSAID): Secondary | ICD-10-CM | POA: Insufficient documentation

## 2018-06-13 DIAGNOSIS — F17210 Nicotine dependence, cigarettes, uncomplicated: Secondary | ICD-10-CM | POA: Insufficient documentation

## 2018-06-13 HISTORY — PX: KNEE ARTHROSCOPY WITH MEDIAL MENISECTOMY: SHX5651

## 2018-06-13 SURGERY — ARTHROSCOPY, KNEE, WITH MEDIAL MENISCECTOMY
Anesthesia: General | Site: Knee | Laterality: Right

## 2018-06-13 MED ORDER — OXYCODONE HCL 5 MG/5ML PO SOLN: 5.0000 mg | Freq: Once | ORAL | Status: DC | PRN

## 2018-06-13 MED ORDER — ACETAMINOPHEN 10 MG/ML IV SOLN
INTRAVENOUS | Status: AC
  Filled 2018-06-13: qty 100

## 2018-06-13 MED ORDER — MEPERIDINE HCL 50 MG/ML IJ SOLN: 6.2500 mg | INTRAMUSCULAR | Status: DC | PRN

## 2018-06-13 MED ORDER — CHLORHEXIDINE GLUCONATE CLOTH 2 % EX PADS
6.0000 | MEDICATED_PAD | Freq: Once | CUTANEOUS | Status: AC
  Administered 2018-06-13: 6 via TOPICAL

## 2018-06-13 MED ORDER — HYDROCODONE-ACETAMINOPHEN 5-325 MG PO TABS
1.0000 | ORAL_TABLET | Freq: Once | ORAL | Status: AC
  Administered 2018-06-13: 1 via ORAL

## 2018-06-13 MED ORDER — PHENYLEPHRINE HCL 10 MG/ML IJ SOLN
INTRAMUSCULAR | Status: AC
  Filled 2018-06-13: qty 1

## 2018-06-13 MED ORDER — KETOROLAC TROMETHAMINE 30 MG/ML IJ SOLN
INTRAMUSCULAR | Status: DC | PRN
  Administered 2018-06-13: 30 mg via INTRAVENOUS

## 2018-06-13 MED ORDER — LIDOCAINE HCL (PF) 2 % IJ SOLN
INTRAMUSCULAR | Status: AC
  Filled 2018-06-13: qty 10

## 2018-06-13 MED ORDER — PROPOFOL 10 MG/ML IV BOLUS
INTRAVENOUS | Status: DC | PRN
  Administered 2018-06-13: 30 mg via INTRAVENOUS
  Administered 2018-06-13: 150 mg via INTRAVENOUS

## 2018-06-13 MED ORDER — LACTATED RINGERS IV SOLN
INTRAVENOUS | Status: DC
  Administered 2018-06-13: 08:00:00 via INTRAVENOUS

## 2018-06-13 MED ORDER — LIDOCAINE HCL (CARDIAC) PF 100 MG/5ML IV SOSY
PREFILLED_SYRINGE | INTRAVENOUS | Status: DC | PRN
  Administered 2018-06-13: 80 mg via INTRAVENOUS

## 2018-06-13 MED ORDER — LIDOCAINE HCL (PF) 1 % IJ SOLN
INTRAMUSCULAR | Status: AC
  Filled 2018-06-13: qty 30

## 2018-06-13 MED ORDER — FENTANYL CITRATE (PF) 100 MCG/2ML IJ SOLN
25.0000 ug | INTRAMUSCULAR | Status: DC | PRN
  Administered 2018-06-13 (×2): 25 ug via INTRAVENOUS
  Administered 2018-06-13: 50 ug via INTRAVENOUS

## 2018-06-13 MED ORDER — CHLORHEXIDINE GLUCONATE CLOTH 2 % EX PADS: 6.0000 | MEDICATED_PAD | Freq: Once | CUTANEOUS | Status: DC

## 2018-06-13 MED ORDER — PROPOFOL 10 MG/ML IV BOLUS
INTRAVENOUS | Status: AC
  Filled 2018-06-13: qty 40

## 2018-06-13 MED ORDER — ONDANSETRON HCL 4 MG PO TABS: 4.0000 mg | ORAL_TABLET | Freq: Three times a day (TID) | ORAL | 0 refills | Status: DC | PRN

## 2018-06-13 MED ORDER — DEXAMETHASONE SODIUM PHOSPHATE 10 MG/ML IJ SOLN
INTRAMUSCULAR | Status: AC
  Filled 2018-06-13: qty 1

## 2018-06-13 MED ORDER — ONDANSETRON HCL 4 MG/2ML IJ SOLN
INTRAMUSCULAR | Status: AC
  Filled 2018-06-13: qty 2

## 2018-06-13 MED ORDER — PROMETHAZINE HCL 25 MG/ML IJ SOLN: 6.2500 mg | INTRAMUSCULAR | Status: DC | PRN

## 2018-06-13 MED ORDER — DEXAMETHASONE SODIUM PHOSPHATE 10 MG/ML IJ SOLN
INTRAMUSCULAR | Status: DC | PRN
  Administered 2018-06-13: 5 mg via INTRAVENOUS

## 2018-06-13 MED ORDER — OXYCODONE HCL 5 MG PO TABS: 5.0000 mg | ORAL_TABLET | Freq: Once | ORAL | Status: DC | PRN

## 2018-06-13 MED ORDER — BUPIVACAINE-EPINEPHRINE (PF) 0.25% -1:200000 IJ SOLN
INTRAMUSCULAR | Status: AC
  Filled 2018-06-13: qty 30

## 2018-06-13 MED ORDER — KETOROLAC TROMETHAMINE 30 MG/ML IJ SOLN
INTRAMUSCULAR | Status: AC
  Filled 2018-06-13: qty 1

## 2018-06-13 MED ORDER — ACETAMINOPHEN 10 MG/ML IV SOLN
INTRAVENOUS | Status: DC | PRN
  Administered 2018-06-13: 1000 mg via INTRAVENOUS

## 2018-06-13 MED ORDER — HYDROCODONE-ACETAMINOPHEN 5-325 MG PO TABS: 1.0000 | ORAL_TABLET | ORAL | 0 refills | Status: DC | PRN

## 2018-06-13 MED ORDER — HYDROCODONE-ACETAMINOPHEN 5-325 MG PO TABS
ORAL_TABLET | ORAL | Status: AC
  Filled 2018-06-13: qty 1

## 2018-06-13 MED ORDER — MIDAZOLAM HCL 2 MG/2ML IJ SOLN
INTRAMUSCULAR | Status: AC
  Filled 2018-06-13: qty 2

## 2018-06-13 MED ORDER — BUPIVACAINE-EPINEPHRINE (PF) 0.25% -1:200000 IJ SOLN
INTRAMUSCULAR | Status: DC | PRN
  Administered 2018-06-13: 30 mL

## 2018-06-13 MED ORDER — ASPIRIN EC 325 MG PO TBEC: 325.0000 mg | DELAYED_RELEASE_TABLET | Freq: Every day | ORAL | 0 refills | Status: DC

## 2018-06-13 MED ORDER — FENTANYL CITRATE (PF) 100 MCG/2ML IJ SOLN
INTRAMUSCULAR | Status: AC
  Administered 2018-06-13: 25 ug via INTRAVENOUS
  Filled 2018-06-13: qty 2

## 2018-06-13 MED ORDER — ONDANSETRON HCL 4 MG/2ML IJ SOLN
INTRAMUSCULAR | Status: DC | PRN
  Administered 2018-06-13: 4 mg via INTRAVENOUS

## 2018-06-13 MED ORDER — FENTANYL CITRATE (PF) 100 MCG/2ML IJ SOLN
INTRAMUSCULAR | Status: DC | PRN
  Administered 2018-06-13: 50 ug via INTRAVENOUS
  Administered 2018-06-13 (×2): 25 ug via INTRAVENOUS

## 2018-06-13 MED ORDER — CEFAZOLIN SODIUM-DEXTROSE 2-4 GM/100ML-% IV SOLN
INTRAVENOUS | Status: AC
  Filled 2018-06-13: qty 100

## 2018-06-13 MED ORDER — FENTANYL CITRATE (PF) 100 MCG/2ML IJ SOLN
INTRAMUSCULAR | Status: AC
  Filled 2018-06-13: qty 2

## 2018-06-13 MED ORDER — MIDAZOLAM HCL 2 MG/2ML IJ SOLN
INTRAMUSCULAR | Status: DC | PRN
  Administered 2018-06-13: 2 mg via INTRAVENOUS

## 2018-06-13 MED ORDER — LIDOCAINE HCL (PF) 1 % IJ SOLN
INTRAMUSCULAR | Status: DC | PRN
  Administered 2018-06-13: 5 mL

## 2018-06-13 SURGICAL SUPPLY — 42 items
ADAPTER IRRIG TUBE 2 SPIKE SOL (ADAPTER) ×6 IMPLANT
BNDG COHESIVE 4X5 TAN STRL (GAUZE/BANDAGES/DRESSINGS) ×3 IMPLANT
BUR RADIUS 3.5 (BURR) ×3 IMPLANT
BUR RADIUS 4.0X18.5 (BURR) ×3 IMPLANT
CLOSURE WOUND 1/2 X4 (GAUZE/BANDAGES/DRESSINGS) ×1
COOLER POLAR GLACIER W/PUMP (MISCELLANEOUS) ×3 IMPLANT
COVER WAND RF STERILE (DRAPES) ×3 IMPLANT
CUFF TOURN 24 STER (MISCELLANEOUS) ×3 IMPLANT
CUFF TOURN 30 STER DUAL PORT (MISCELLANEOUS) IMPLANT
DRAPE IMP U-DRAPE 54X76 (DRAPES) ×3 IMPLANT
DURAPREP 26ML APPLICATOR (WOUND CARE) ×3 IMPLANT
GAUZE PETRO XEROFOAM 1X8 (MISCELLANEOUS) ×3 IMPLANT
GAUZE SPONGE 4X4 12PLY STRL (GAUZE/BANDAGES/DRESSINGS) ×3 IMPLANT
GLOVE BIOGEL PI IND STRL 7.5 (GLOVE) ×3 IMPLANT
GLOVE BIOGEL PI IND STRL 9 (GLOVE) ×1 IMPLANT
GLOVE BIOGEL PI INDICATOR 7.5 (GLOVE) ×6
GLOVE BIOGEL PI INDICATOR 9 (GLOVE) ×2
GLOVE SURG 9.0 ORTHO LTXF (GLOVE) ×6 IMPLANT
GOWN STRL REUS W/ TWL LRG LVL3 (GOWN DISPOSABLE) ×1 IMPLANT
GOWN STRL REUS W/TWL 2XL LVL3 (GOWN DISPOSABLE) ×3 IMPLANT
GOWN STRL REUS W/TWL LRG LVL3 (GOWN DISPOSABLE) ×2
IV LACTATED RINGER IRRG 3000ML (IV SOLUTION) ×8
IV LR IRRIG 3000ML ARTHROMATIC (IV SOLUTION) ×4 IMPLANT
KIT TURNOVER KIT A (KITS) ×3 IMPLANT
MANIFOLD NEPTUNE II (INSTRUMENTS) ×3 IMPLANT
MAT ABSORB  FLUID 56X50 GRAY (MISCELLANEOUS) ×2
MAT ABSORB FLUID 56X50 GRAY (MISCELLANEOUS) ×1 IMPLANT
NEEDLE HYPO 22GX1.5 SAFETY (NEEDLE) ×3 IMPLANT
PACK ARTHROSCOPY KNEE (MISCELLANEOUS) ×3 IMPLANT
PAD ABD DERMACEA PRESS 5X9 (GAUZE/BANDAGES/DRESSINGS) ×6 IMPLANT
PAD WRAPON POLAR KNEE (MISCELLANEOUS) ×1 IMPLANT
SET TUBE SUCT SHAVER OUTFL 24K (TUBING) ×3 IMPLANT
SOL PREP PVP 2OZ (MISCELLANEOUS) ×3
SOLUTION PREP PVP 2OZ (MISCELLANEOUS) ×1 IMPLANT
STRIP CLOSURE SKIN 1/2X4 (GAUZE/BANDAGES/DRESSINGS) ×2 IMPLANT
SUT ETHILON 4-0 (SUTURE) ×2
SUT ETHILON 4-0 FS2 18XMFL BLK (SUTURE) ×1
SUTURE ETHLN 4-0 FS2 18XMF BLK (SUTURE) ×1 IMPLANT
TUBING ARTHRO INFLOW-ONLY STRL (TUBING) ×3 IMPLANT
WAND HAND CNTRL MULTIVAC 50 (MISCELLANEOUS) IMPLANT
WAND HAND CNTRL MULTIVAC 90 (MISCELLANEOUS) ×3 IMPLANT
WRAPON POLAR PAD KNEE (MISCELLANEOUS) ×3

## 2018-06-13 NOTE — Transfer of Care (Signed)
Immediate Anesthesia Transfer of Care Note  Patient: Tiffany Mata  Procedure(s) Performed: KNEE ARTHROSCOPY WITH MEDIAL MENISECTOMY (Right Knee)  Patient Location: PACU  Anesthesia Type:General  Level of Consciousness: awake and alert   Airway & Oxygen Therapy: Patient Spontanous Breathing and Patient connected to face mask oxygen  Post-op Assessment: Report given to RN and Post -op Vital signs reviewed and stable  Post vital signs: Reviewed and stable  Last Vitals:  Vitals Value Taken Time  BP    Temp    Pulse    Resp    SpO2      Last Pain:  Vitals:   06/13/18 0612  TempSrc: Tympanic  PainSc:          Complications: No apparent anesthesia complications

## 2018-06-13 NOTE — Anesthesia Procedure Notes (Signed)
Procedure Name: LMA Insertion Date/Time: 06/13/2018 8:14 AM Performed by: Chanetta Marshall, CRNA Pre-anesthesia Checklist: Patient identified, Emergency Drugs available, Suction available and Patient being monitored Patient Re-evaluated:Patient Re-evaluated prior to induction Oxygen Delivery Method: Circle system utilized Preoxygenation: Pre-oxygenation with 100% oxygen Induction Type: IV induction Ventilation: Mask ventilation without difficulty LMA: LMA inserted LMA Size: 4.0 Number of attempts: 1 Placement Confirmation: positive ETCO2,  CO2 detector and breath sounds checked- equal and bilateral Tube secured with: Tape Dental Injury: Teeth and Oropharynx as per pre-operative assessment

## 2018-06-13 NOTE — H&P (Signed)
PREOPERATIVE H&P  Chief Complaint: TEAR OF MEDIAL MENISCUS OF RIGHT KNEE  HPI: Tiffany Mata is a 52 y.o. female who presents for preoperative history and physical with a diagnosis of TEAR OF MEDIAL MENISCUS OF RIGHT KNEE. Symptoms are rated as moderate to severe, and have been worsening.  This is significantly impairing activities of daily living.  She has elected for surgical management.   Past Medical History:  Diagnosis Date  . GERD (gastroesophageal reflux disease)    occ  . Stress    Past Surgical History:  Procedure Laterality Date  . CESAREAN SECTION    . DILATION AND CURETTAGE OF UTERUS    . FOOT SURGERY    . HALLUX VALGUS CORRECTION    . TOE SURGERY    . TONSILLECTOMY AND ADENOIDECTOMY     Social History   Socioeconomic History  . Marital status: Married    Spouse name: Not on file  . Number of children: Not on file  . Years of education: Not on file  . Highest education level: Not on file  Occupational History  . Not on file  Social Needs  . Financial resource strain: Not on file  . Food insecurity:    Worry: Not on file    Inability: Not on file  . Transportation needs:    Medical: Not on file    Non-medical: Not on file  Tobacco Use  . Smoking status: Current Some Day Smoker    Years: 20.00    Types: Cigarettes  . Smokeless tobacco: Never Used  . Tobacco comment: very light smoker  Substance and Sexual Activity  . Alcohol use: No    Alcohol/week: 0.0 standard drinks  . Drug use: No  . Sexual activity: Yes  Lifestyle  . Physical activity:    Days per week: 6 days    Minutes per session: 40 min  . Stress: Only a little  Relationships  . Social connections:    Talks on phone: More than three times a week    Gets together: Twice a week    Attends religious service: More than 4 times per year    Active member of club or organization: No    Attends meetings of clubs or organizations: Never    Relationship status: Married  Other Topics Concern  .  Not on file  Social History Narrative   Married   2 daughters- ages 80 and 79   She works as a Art therapist, husband works for [employer] History  Problem Relation Age of Onset  . Stroke Mother   . Hypertension Mother   . Cancer Mother   . Diabetes Father    No Known Allergies Prior to Admission medications   Medication Sig Start Date End Date Taking? Authorizing Provider  acetaminophen (TYLENOL) 500 MG tablet Take 500 mg by mouth every 6 (six) hours as needed.   Yes [provider]  Calcium Carbonate (CALCIUM 600 PO) Take 600 mg by mouth daily.    Yes [provider]  ibuprofen (ADVIL,MOTRIN) 800 MG tablet Take 1 tablet (800 mg total) by mouth every 8 (eight) hours as needed. Patient taking differently: Take 800 mg by mouth every 8 (eight) hours as needed (for pain.).  08/05/17  Yes Volney American, PA-C  POTASSIUM PO Take 1 tablet by mouth daily.    Yes [provider]  ranitidine (ZANTAC) 150 MG capsule Take 150 mg by mouth as needed for heartburn.   Yes [provider]  predniSONE (DELTASONE) 10 MG tablet Take 6 tabs day one, 5 tabs day two, 4 tabs day three, etc Patient not taking: Reported on 06/01/2018 04/27/18   Volney American, PA-C     Positive ROS: All other systems have been reviewed and were otherwise negative with the exception of those mentioned in the HPI and as above.  Physical Exam: General: Alert, no acute distress Cardiovascular: Regular rate and rhythm, no murmurs rubs or gallops.  No pedal edema Respiratory: Clear to auscultation bilaterally, no wheezes rales or rhonchi. No cyanosis, no use of accessory musculature GI: No organomegaly, abdomen is soft and non-tender nondistended with positive bowel sounds. Skin: Skin intact, no lesions within the operative field. Neurologic: Sensation intact distally Psychiatric: Patient is competent for consent with normal mood and affect Lymphatic: No axillary or cervical  lymphadenopathy  MUSCULOSKELETAL: Right knee:  Tenderness over the medial joint line.  + McMurrays, NVI, no ligamentous laxity.  No erythema, ecchymosis or effusion.  Positive patellofemoral crepitus but no grind or apprehension.  Assessment: TEAR OF MEDIAL MENISCUS OF RIGHT KNEE  Plan: Plan for Procedure(s): KNEE ARTHROSCOPY WITH MEDIAL MENISECTOMY  I discussed the details of the right knee arthroscopy surgery with the patient and her husband who is at the bedside.  I have also discussed in detail the postoperative course.  I answered all questions.  I discussed the risks and benefits of surgery. They understand risks include but are not limited to infection, bleeding, nerve or blood vessel injury, joint stiffness or loss of motion, persistent pain, weakness or instability, and the need for further surgery. Medical risks include but are not limited to DVT and pulmonary embolism, myocardial infarction, stroke, pneumonia, respiratory failure and death. Patient understood these risks and wished to proceed.     Thornton Park, MD   06/13/2018 7:58 AM

## 2018-06-13 NOTE — Discharge Instructions (Signed)

## 2018-06-13 NOTE — Anesthesia Preprocedure Evaluation (Signed)
Anesthesia Evaluation  Patient identified by MRN, date of birth, ID band Patient awake    Reviewed: Allergy & Precautions, NPO status , Patient's Chart, lab work & pertinent test results  History of Anesthesia Complications Negative for: history of anesthetic complications  Airway Mallampati: II  TM Distance: <3 FB Neck ROM: Full    Dental no notable dental hx.    Pulmonary neg sleep apnea, neg COPD, Current Smoker,    breath sounds clear to auscultation- rhonchi (-) wheezing      Cardiovascular Exercise Tolerance: Good (-) hypertension(-) CAD, (-) Past MI, (-) Cardiac Stents and (-) CABG  Rhythm:Regular Rate:Normal - Systolic murmurs and - Diastolic murmurs    Neuro/Psych neg Seizures PSYCHIATRIC DISORDERS negative neurological ROS     GI/Hepatic Neg liver ROS, GERD  ,  Endo/Other  negative endocrine ROSneg diabetes  Renal/GU negative Renal ROS     Musculoskeletal negative musculoskeletal ROS (+)   Abdominal (+) - obese,   Peds  Hematology negative hematology ROS (+)   Anesthesia Other Findings Past Medical History: No date: GERD (gastroesophageal reflux disease)     Comment:  occ No date: Stress   Reproductive/Obstetrics                             Anesthesia Physical Anesthesia Plan  ASA: II  Anesthesia Plan: General   Post-op Pain Management:    Induction: Intravenous  PONV Risk Score and Plan: 1 and Ondansetron and Midazolam  Airway Management Planned: LMA  Additional Equipment:   Intra-op Plan:   Post-operative Plan:   Informed Consent: I have reviewed the patients History and Physical, chart, labs and discussed the procedure including the risks, benefits and alternatives for the proposed anesthesia with the patient or authorized representative who has indicated his/her understanding and acceptance.   Dental advisory given  Plan Discussed with: CRNA and  Anesthesiologist  Anesthesia Plan Comments:         Anesthesia Quick Evaluation

## 2018-06-13 NOTE — Anesthesia Post-op Follow-up Note (Signed)
Anesthesia QCDR form completed.        

## 2018-06-13 NOTE — Anesthesia Postprocedure Evaluation (Signed)
Anesthesia Post Note  Patient: Sandeep T Bjelland  Procedure(s) Performed: KNEE ARTHROSCOPY WITH MEDIAL MENISECTOMY (Right Knee)  Patient location during evaluation: PACU Anesthesia Type: General Level of consciousness: awake and alert and oriented Pain management: pain level controlled Vital Signs Assessment: post-procedure vital signs reviewed and stable Respiratory status: spontaneous breathing, nonlabored ventilation and respiratory function stable Cardiovascular status: blood pressure returned to baseline and stable Postop Assessment: no signs of nausea or vomiting Anesthetic complications: no     Last Vitals:  Vitals:   06/13/18 1023 06/13/18 1122  BP: (!) 145/99 (!) 127/91  Pulse: 79 73  Resp: 16 16  Temp: (!) 36 C   SpO2: 96% 97%    Last Pain:  Vitals:   06/13/18 1023  TempSrc: Temporal  PainSc: 4                  Altair Appenzeller

## 2018-06-13 NOTE — Op Note (Signed)
PATIENT:  Tiffany Mata  PRE-OPERATIVE DIAGNOSIS:  TEAR OF MEDIAL MENISCUS, RIGHT KNEE  POST-OPERATIVE DIAGNOSIS:  TEAR OF MEDIAL MENISCUS, RIGHT KNEE with patellofemoral arthrosis and focal chondral lesion of medial femoral condyle x 2  PROCEDURE:  RIGHT KNEE ARTHROSCOPY WITH  Partial MEDIAL MENISECTOMY, synovectomy, chondroplasty of the patellofemoral joint and medial femoral condyle  SURGEON:  Thornton Park, MD  ANESTHESIA:   General  PREOPERATIVE INDICATIONS:  [name]  52 y.o. female with a diagnosis of TEAR OF MEDIAL MENISCUS, RIGHT KNEE who failed conservative management and elected for surgical management.    The risks benefits and alternatives were discussed with the patient preoperatively including the risks of infection, bleeding, nerve injury, knee stiffness, persistent pain, osteoarthritis and the need for further surgery. Medical  risks include DVT and pulmonary embolism, myocardial infarction, stroke, pneumonia, respiratory failure and death. The patient understood these risks and wished to proceed.  OPERATIVE FINDINGS: Right knee horizontal cleavage tear exiting undersurface of medial meniscus at the posterior horn and body.  Diffuse chondrosis of the undersurface of the patella with areas of full-thickness cartilage loss.  Diffuse chondrosis of the central trochlea extending to the lateral facet.  2 small focal chondral lesions of the medial femoral condyle.  The ACL was intact.  There was no chondral lesions or meniscal tear in the lateral compartment.  OPERATIVE PROCEDURE: Patient was met in the preoperative area. The operative extremity was signed with the word yes and my initials according the hospital's correct site of surgery protocol.  A preop history and physical was performed.  The patient's husband was at the bedside.  I answered all questions by the patient and her husband.  The patient was brought to the operating room where she was placed supine on the  operative table. General anesthesia was administered. The patient was prepped and draped in a sterile fashion.  A timeout was performed to verify the patient's name, date of birth, medical record number, correct site of surgery correct procedure to be performed. It was also used to verify the patient received antibiotics that all appropriate instruments, and radiographic studies were available in the room. Once all in attendance were in agreement, the case began.  Proposed arthroscopy incisions were drawn out with a surgical marker. These were pre-injected with 1% lidocaine plain. An 11 blade was used to establish an inferior lateral and inferomedial portals. The inferomedial portal was created using a 18-gauge spinal needle under direct visualization.  A full diagnostic examination of the knee was performed including the suprapatellar pouch, patellofemoral joint, medial lateral compartments as well as the medial lateral gutters, the intercondylar notch in the posterior knee.  Findings on arthroscopy are noted above  Patient had the meniscal tear treated with a 4.0 resector shaver blade and straight duckbill basket. The meniscus was debrided until a stable rim was achieved. A chondroplasty of the medial femoral condyle, trochlea and undersurface of patella was also performed using a 4.0 resector shaver blade. A partial synovectomy was also performed using a 4-0 resector shaver blade and 90 ArthroCare wand.  The knee was then copiously lavaged. All arthroscopic instruments were removed. The 2 arthroscopy portals were closed with 4-0 nylon. Steri-Strips were applied along with a dry sterile and compressive dressing. The patient was brought to the PACU in stable condition. I was scrubbed and present for the entire case and all sharp and instrument counts were correct at the conclusion the case. I spoke with the patient's husband postoperatively to  let him know the case was performed without complication  and the patient was stable in the recovery room.    Timoteo Gaul, MD

## 2018-06-22 DIAGNOSIS — M25561 Pain in right knee: Secondary | ICD-10-CM | POA: Diagnosis not present

## 2018-06-26 NOTE — Telephone Encounter (Signed)
Opened in error

## 2018-06-30 DIAGNOSIS — M25561 Pain in right knee: Secondary | ICD-10-CM | POA: Diagnosis not present

## 2018-07-05 DIAGNOSIS — M25561 Pain in right knee: Secondary | ICD-10-CM | POA: Diagnosis not present

## 2018-07-07 DIAGNOSIS — M25561 Pain in right knee: Secondary | ICD-10-CM | POA: Diagnosis not present

## 2018-07-14 IMAGING — DX DG TIBIA/FIBULA 2V*L*
3 series · 3 of 3 positions shown · non-contrast
Comparison: None.

CLINICAL DATA: Pt reports falling in garage today and felt her left
leg give out from under her. Pt reports left leg pain from knee to
ankle.

EXAM:
LEFT TIBIA AND FIBULA - 2 VIEW

[tibia ap]
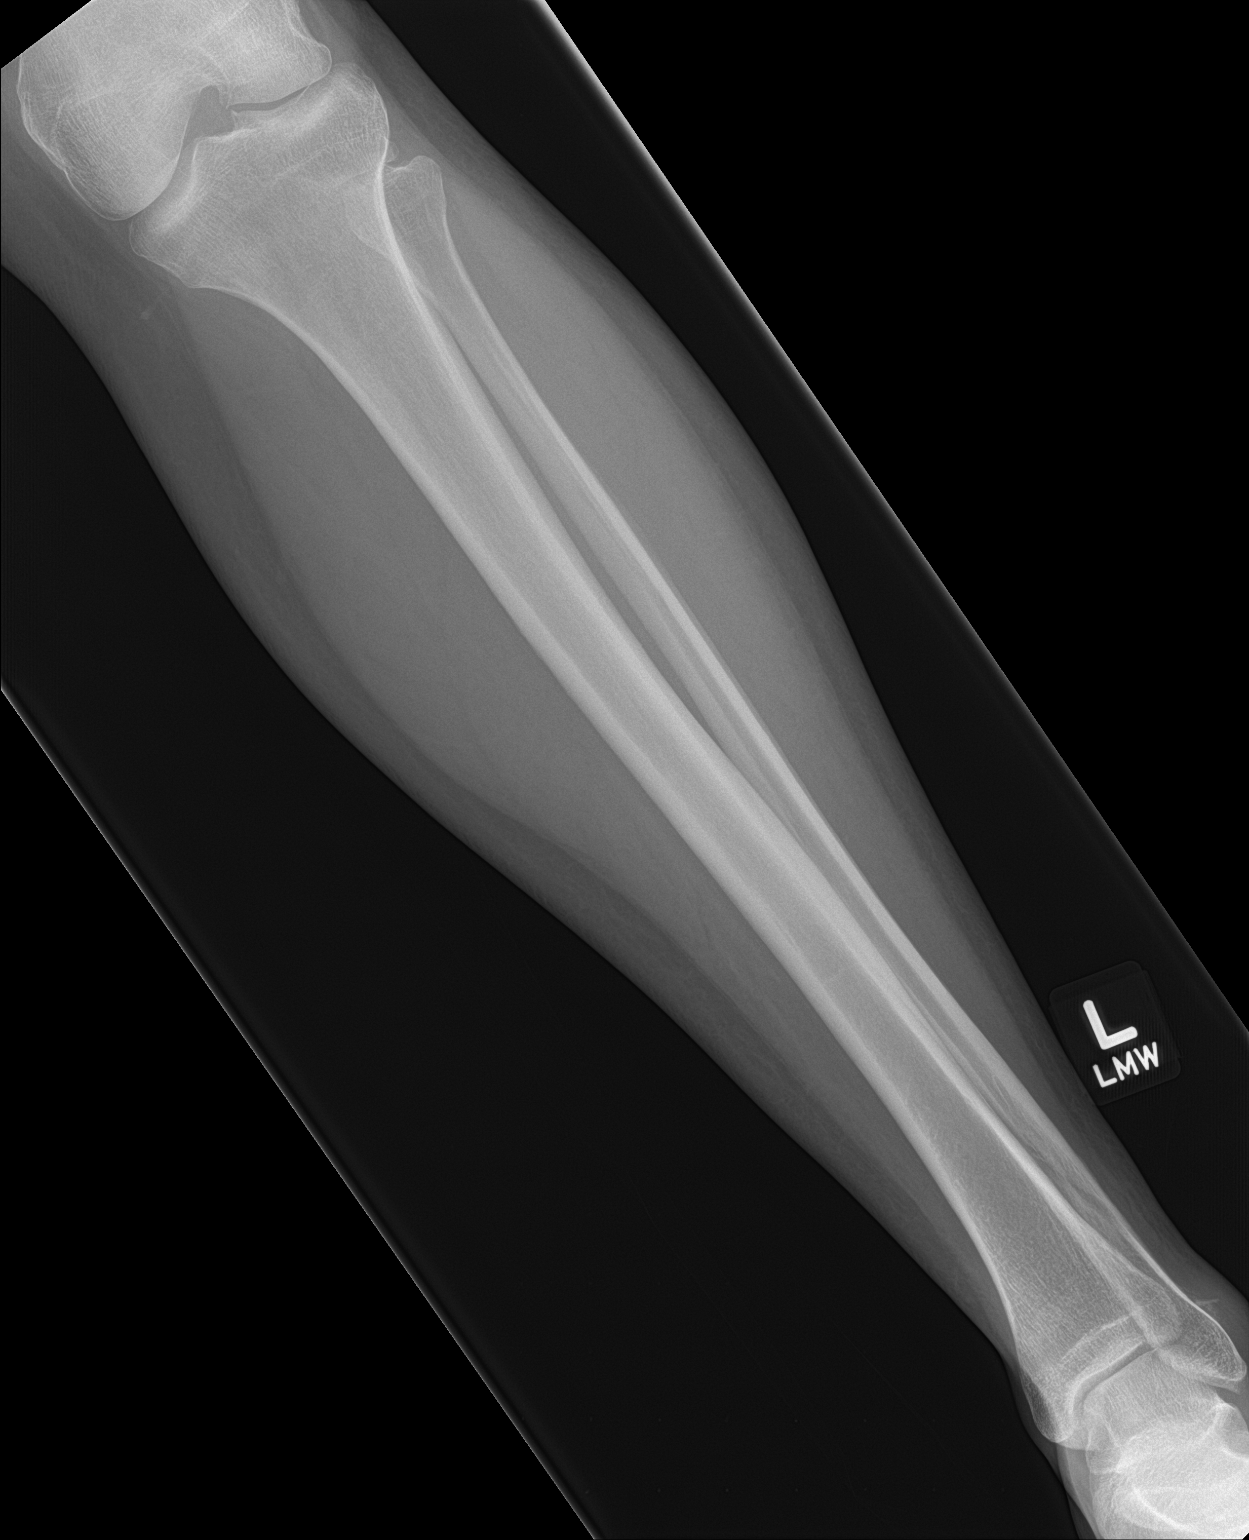

[ankle lat]
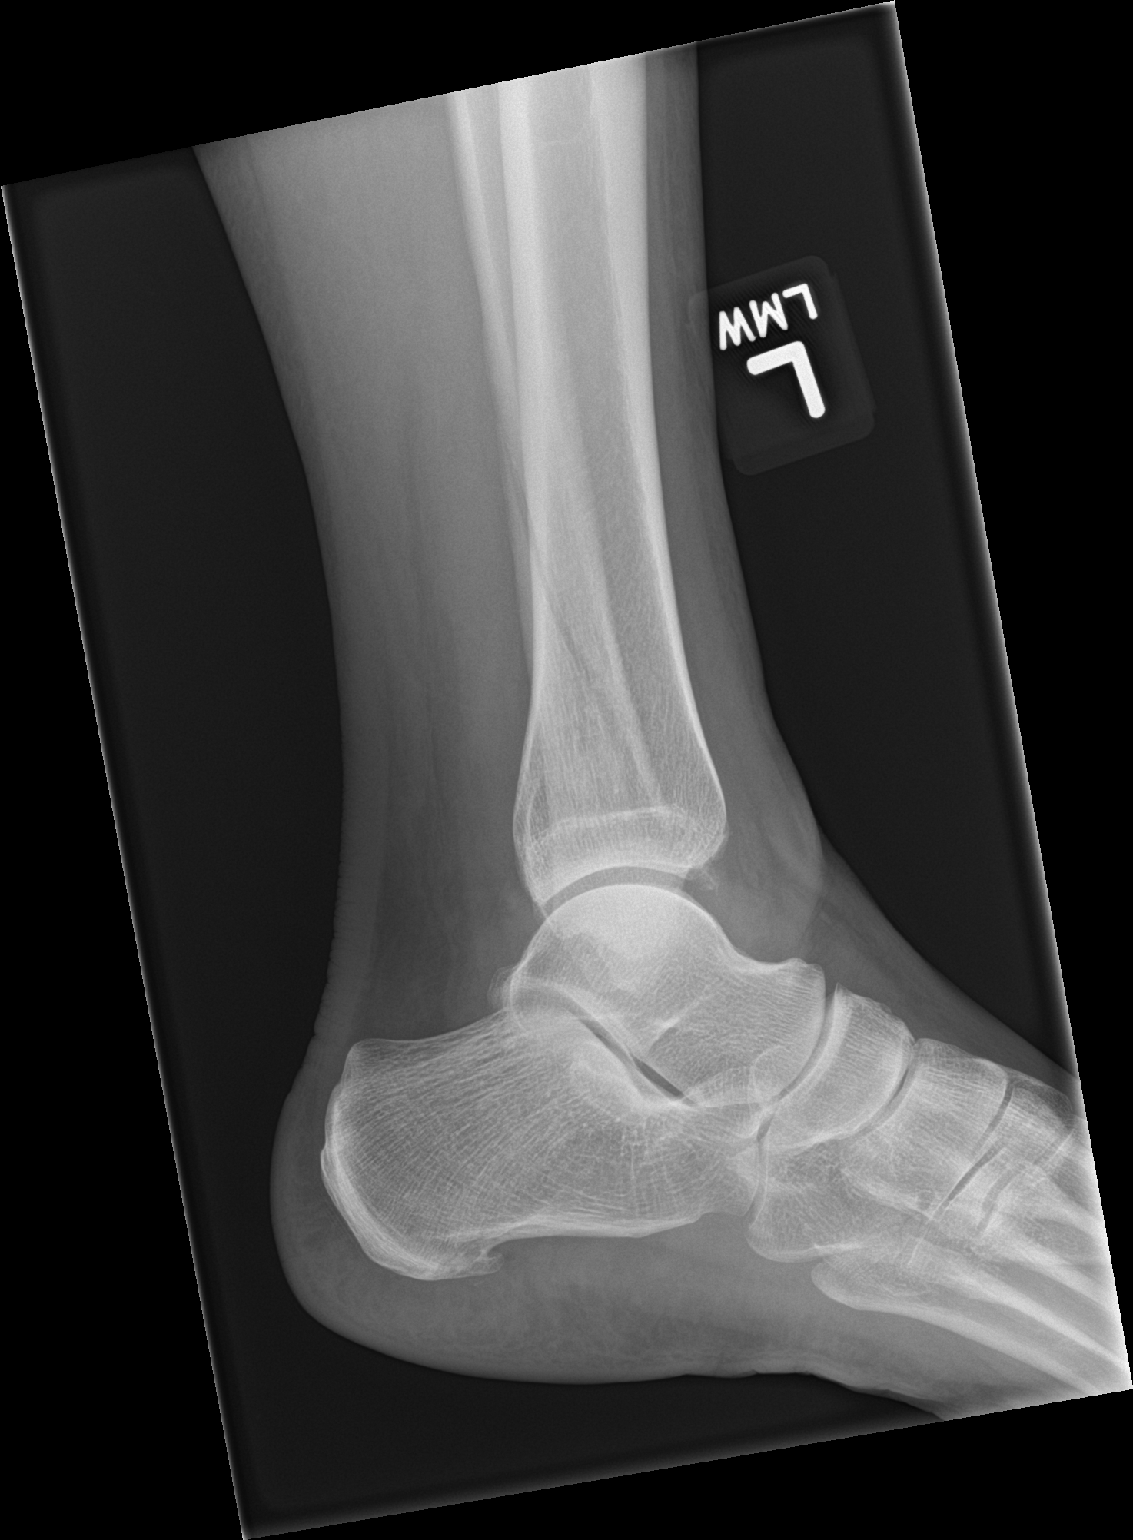

[tibia lat]
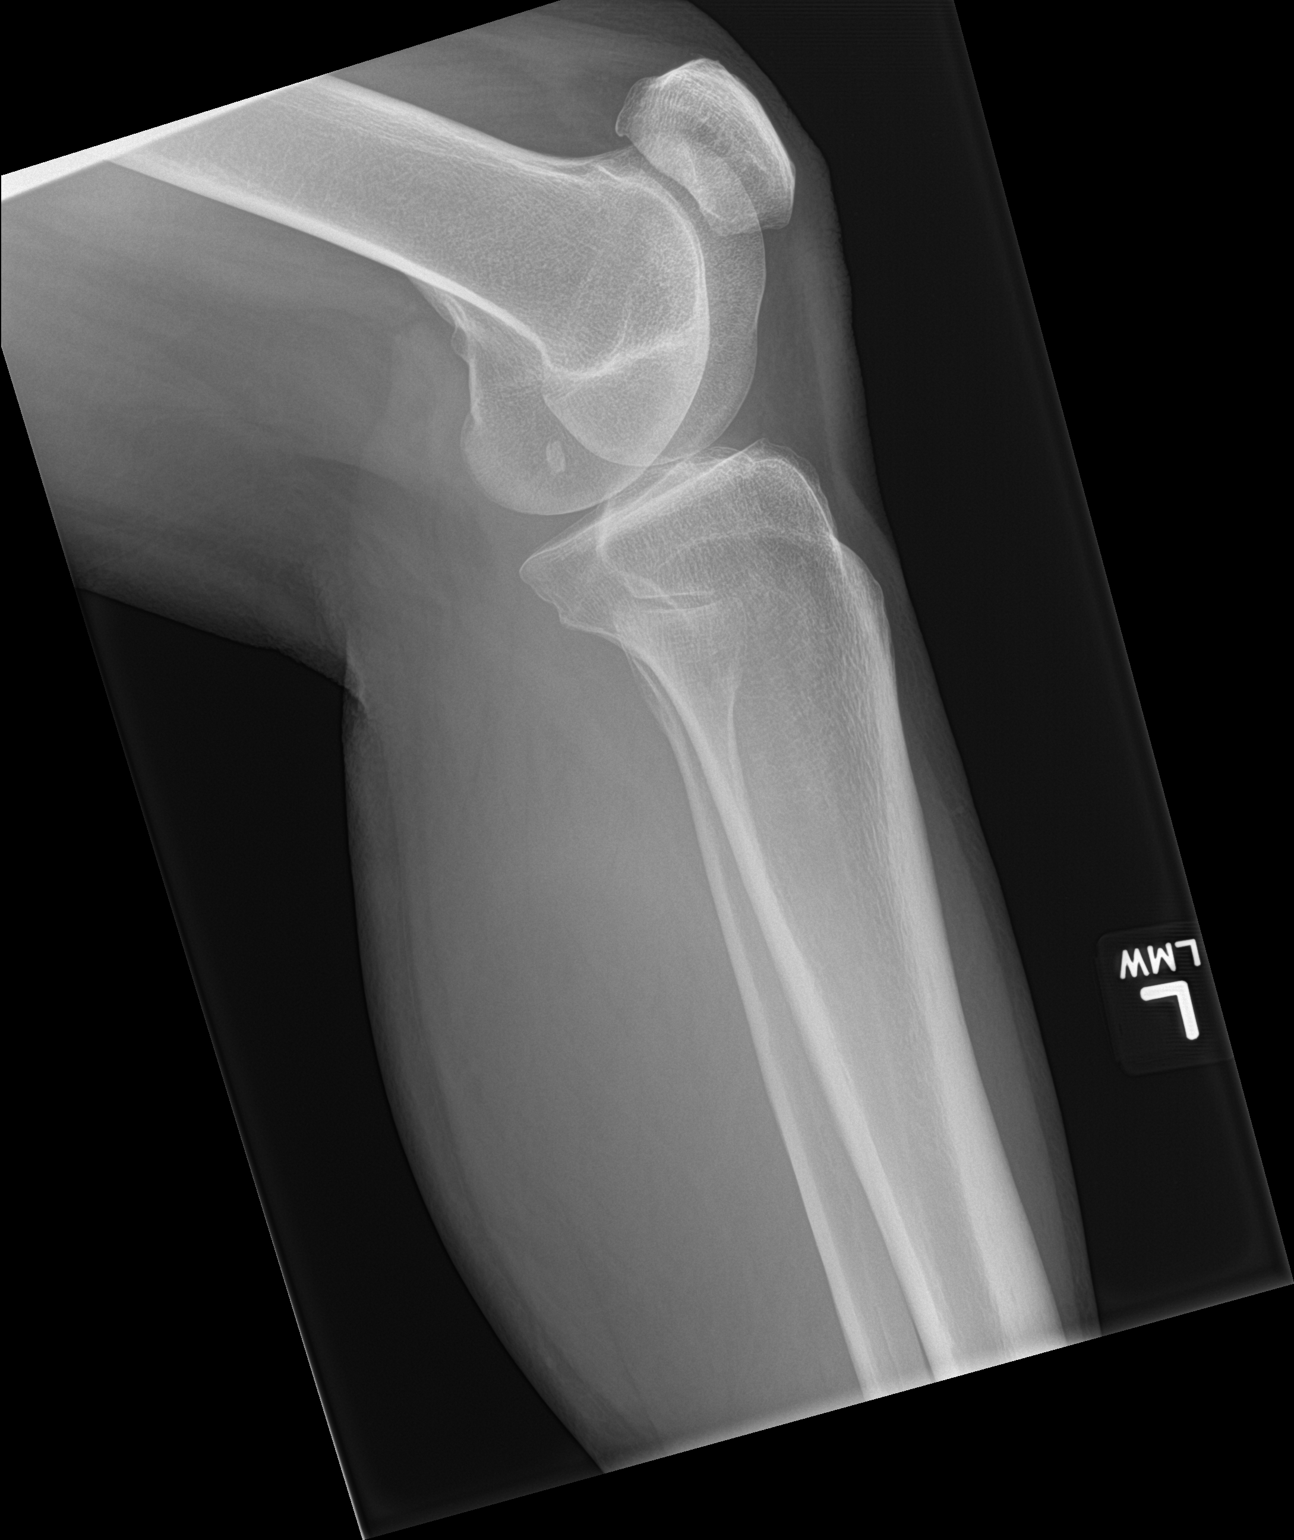

[3 of 3 positions shown; findings below may reference images not displayed]

FINDINGS: There is an oblique fracture of the distal fibula. This extends from
the posterior distal shaft to the anterior metaphysis. Fracture is
nondisplaced and non comminuted.

No other fractures.  The knee and ankle joints are normally aligned.

There is mild soft tissue swelling along the lateral aspect of the
lower leg and ankle.
IMPRESSION: Nondisplaced fracture of the distal fibula as described.

## 2018-08-07 ENCOUNTER — Encounter: Payer: Self-pay | Admitting: Family Medicine

## 2018-08-07 ENCOUNTER — Ambulatory Visit (INDEPENDENT_AMBULATORY_CARE_PROVIDER_SITE_OTHER): Payer: Managed Care, Other (non HMO) | Admitting: Family Medicine

## 2018-08-07 VITALS — BP 129/86 | HR 71 | Temp 97.9°F | Ht 67.0 in | Wt 167.2 lb

## 2018-08-07 DIAGNOSIS — Z0000 Encounter for general adult medical examination without abnormal findings: Secondary | ICD-10-CM

## 2018-08-07 LAB — UA/M W/RFLX CULTURE, ROUTINE
Bilirubin, UA: NEGATIVE
Glucose, UA: NEGATIVE
Ketones, UA: NEGATIVE
Leukocytes, UA: NEGATIVE
NITRITE UA: NEGATIVE
Specific Gravity, UA: >1.03 — ABNORMAL HIGH (ref 1.005–1.030)
Urobilinogen, Ur: 0.2 mg/dL (ref 0.2–1.0)
pH, UA: 5 (ref 5.0–7.5)

## 2018-08-07 LAB — MICROSCOPIC EXAMINATION

## 2018-08-07 MED ORDER — IBUPROFEN 800 MG PO TABS: 800.0000 mg | ORAL_TABLET | Freq: Three times a day (TID) | ORAL | 11 refills | Status: DC | PRN

## 2018-08-07 NOTE — Progress Notes (Signed)
BP 129/86 (BP Location: Left Arm, Patient Position: Sitting, Cuff Size: Normal)   Pulse 71   Temp 97.9 F (36.6 C) (Oral)   Ht 5\' 7"  (1.702 m)   Wt 167 lb 3.2 oz (75.8 kg)   SpO2 100%   BMI 26.19 kg/m    Subjective:    Patient ID: Tiffany Mata, female    DOB: Jul 21, 1965, 53 y.o.   MRN: [id number]  HPI: AMA MCMASTER is a 53 y.o. female presenting on 08/07/2018 for comprehensive medical examination. Current medical complaints include:none  She currently lives with: Menopausal Symptoms: no  Depression Screen done today and results listed below:  Depression screen [facility] 2/9 08/05/2017  Decreased Interest 0  Down, Depressed, Hopeless 0  PHQ - 2 Score 0  Altered sleeping 0  Tired, decreased energy 0  Change in appetite 0  Feeling bad or failure about yourself  0  Trouble concentrating 0  Moving slowly or fidgety/restless 0  Suicidal thoughts 0  PHQ-9 Score 0    The patient does not have a history of falls. I did not complete a risk assessment for falls. A plan of care for falls was not documented.   Past Medical History:  Past Medical History:  Diagnosis Date  . GERD (gastroesophageal reflux disease)    occ  . Stress     Surgical History:  Past Surgical History:  Procedure Laterality Date  . CESAREAN SECTION    . DILATION AND CURETTAGE OF UTERUS    . FOOT SURGERY    . HALLUX VALGUS CORRECTION    . KNEE ARTHROSCOPY WITH MEDIAL MENISECTOMY Right 06/13/2018   Procedure: KNEE ARTHROSCOPY WITH MEDIAL MENISECTOMY;  Surgeon: Thornton Park, MD;  Location: [facility] ORS;  Service: Orthopedics;  Laterality: Right;  . TOE SURGERY    . TONSILLECTOMY AND ADENOIDECTOMY      Medications:  Current Outpatient Medications on File Prior to Visit  Medication Sig  . acetaminophen (TYLENOL) 500 MG tablet Take 500 mg by mouth every 6 (six) hours as needed.  Marland Kitchen aspirin EC 325 MG tablet Take 1 tablet (325 mg total) by mouth daily.  . Biotin w/ Vitamins C & E (HAIR/SKIN/NAILS PO) Take 1  Dose by mouth.  . Calcium Carbonate (CALCIUM 600 PO) Take 600 mg by mouth daily.   Marland Kitchen POTASSIUM PO Take 1 tablet by mouth daily.   . ranitidine (ZANTAC) 150 MG capsule Take 150 mg by mouth as needed for heartburn.   No current facility-administered medications on file prior to visit.     Allergies:  No Known Allergies  Social History:  Social History   Socioeconomic History  . Marital status: Married    Spouse name: Not on file  . Number of children: Not on file  . Years of education: Not on file  . Highest education level: Not on file  Occupational History  . Not on file  Social Needs  . Financial resource strain: Not on file  . Food insecurity:    Worry: Not on file    Inability: Not on file  . Transportation needs:    Medical: Not on file    Non-medical: Not on file  Tobacco Use  . Smoking status: Former Smoker    Years: 20.00    Types: Cigarettes  . Smokeless tobacco: Never Used  . Tobacco comment: very light smoker  Substance and Sexual Activity  . Alcohol use: No    Alcohol/week: 0.0 standard drinks  . Drug use: No  .  Sexual activity: Yes  Lifestyle  . Physical activity:    Days per week: 6 days    Minutes per session: 40 min  . Stress: Only a little  Relationships  . Social connections:    Talks on phone: More than three times a week    Gets together: Twice a week    Attends religious service: More than 4 times per year    Active member of club or organization: No    Attends meetings of clubs or organizations: Never    Relationship status: Married  . Intimate partner violence:    Fear of current or ex partner: No    Emotionally abused: No    Physically abused: No    Forced sexual activity: No  Other Topics Concern  . 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 Years: 20.00  . Types:  Cigarettes  Smokeless Tobacco Never Used  Tobacco Comment   very light smoker   Social History   Substance and Sexual Activity  Alcohol Use No  . Alcohol/week: 0.0 standard drinks    Family History:  Family History  Problem Relation Age of Onset  . Stroke Mother   . Hypertension Mother   . Cancer Mother   . Diabetes Father     Past medical history, surgical history, medications, allergies, family history and social history reviewed with patient today and changes made to appropriate areas of the chart.   Review of Systems - General ROS: negative Psychological ROS: negative Ophthalmic ROS: negative ENT ROS: negative Allergy and Immunology ROS: negative Hematological and Lymphatic ROS: negative Endocrine ROS: negative Breast ROS: negative for breast lumps Respiratory ROS: no cough, shortness of breath, or wheezing Cardiovascular ROS: no chest pain or dyspnea on exertion Gastrointestinal ROS: no abdominal pain, change in bowel habits, or black or bloody stools Genito-Urinary ROS: no dysuria, trouble voiding, or hematuria Musculoskeletal ROS: negative Neurological ROS: no TIA or stroke symptoms Dermatological ROS: negative All other ROS negative except what is listed above and in the HPI.      Objective:    BP 129/86 (BP Location: Left Arm, Patient Position: Sitting, Cuff Size: Normal)   Pulse 71   Temp 97.9 F (36.6 C) (Oral)   Ht 5\' 7"  (1.702 m)   Wt 167 lb 3.2 oz (75.8 kg)   SpO2 100%   BMI 26.19 kg/m   Wt Readings from Last 3 Encounters:  08/07/18 167 lb 3.2 oz (75.8 kg)  06/13/18 165 lb (74.8 kg)  04/27/18 165 lb 8 oz (75.1 kg)    Physical Exam Vitals signs and nursing note reviewed.  Constitutional:      General: She is not in acute distress.    Appearance: She is well-developed.  HENT:     Head: Atraumatic.     Right Ear: External ear normal.     Left Ear: External ear normal.     Nose: Nose normal.     Mouth/Throat:     Pharynx: No oropharyngeal  exudate.  Eyes:     General: No scleral icterus.    Conjunctiva/sclera: Conjunctivae normal.     Pupils: Pupils are equal, round, and reactive to light.  Neck:     Musculoskeletal: Normal range of motion and neck supple.     Thyroid:  No thyromegaly.  Cardiovascular:     Rate and Rhythm: Normal rate and regular rhythm.     Heart sounds: Normal heart sounds.  Pulmonary:     Effort: Pulmonary effort is normal. No respiratory distress.     Breath sounds: Normal breath sounds.  Chest:     Comments: Breast exam done through GYN Abdominal:     General: Bowel sounds are normal.     Palpations: Abdomen is soft. There is no mass.     Tenderness: There is no abdominal tenderness.  Genitourinary:    Comments: Exam deferred, done through GYN Musculoskeletal: Normal range of motion.        General: No tenderness.  Lymphadenopathy:     Cervical: No cervical adenopathy.  Skin:    General: Skin is warm and dry.     Findings: No rash.  Neurological:     Mental Status: She is alert and oriented to person, place, and time.     Cranial Nerves: No cranial nerve deficit.  Psychiatric:        Behavior: Behavior normal.     Results for orders placed or performed during the hospital encounter of 06/07/18  APTT  Result Value Ref Range   aPTT 34 24 - 36 seconds  Basic metabolic panel  Result Value Ref Range   Sodium 135 135 - 145 mmol/L   Potassium 3.4 (L) 3.5 - 5.1 mmol/L   Chloride 102 98 - 111 mmol/L   CO2 25 22 - 32 mmol/L   Glucose, Bld 118 (H) 70 - 99 mg/dL   BUN 15 6 - 20 mg/dL   Creatinine, Ser 1.07 (H) 0.44 - 1.00 mg/dL   Calcium 8.6 (L) 8.9 - 10.3 mg/dL   GFR calc non Af Amer 59 (L) >60 mL/min   GFR calc Af Amer >60 >60 mL/min   Anion gap 8 5 - 15  CBC WITH DIFFERENTIAL  Result Value Ref Range   WBC 6.2 4.0 - 10.5 K/uL   RBC 4.80 3.87 - 5.11 MIL/uL   Hemoglobin 14.7 12.0 - 15.0 g/dL   HCT 43.6 36.0 - 46.0 %   MCV 90.8 80.0 - 100.0 fL   MCH 30.6 26.0 - 34.0 pg   MCHC 33.7  30.0 - 36.0 g/dL   RDW 12.8 11.5 - 15.5 %   Platelets 216 150 - 400 K/uL   nRBC 0.0 0.0 - 0.2 %   Neutrophils Relative % 60 %   Neutro Abs 3.7 1.7 - 7.7 K/uL   Lymphocytes Relative 30 %   Lymphs Abs 1.9 0.7 - 4.0 K/uL   Monocytes Relative 9 %   Monocytes Absolute 0.5 0.1 - 1.0 K/uL   Eosinophils Relative 1 %   Eosinophils Absolute 0.1 0.0 - 0.5 K/uL   Basophils Relative 0 %   Basophils Absolute 0.0 0.0 - 0.1 K/uL   Immature Granulocytes 0 %   Abs Immature Granulocytes 0.02 0.00 - 0.07 K/uL  Protime-INR  Result Value Ref Range   Prothrombin Time 13.4 11.4 - 15.2 seconds   INR 1.03       Assessment & Plan:   Problem List Items Addressed This Visit    None    Visit Diagnoses    Annual physical exam    -  Primary   Relevant Orders   CBC with Differential/Platelet   Comprehensive metabolic panel   Lipid Panel w/o Chol/HDL Ratio   UA/M w/rflx Culture, Routine   TSH  Follow up plan: Return in about 1 year (around 08/08/2019) for CPE.   LABORATORY TESTING:  - Pap smear: done elsewhere  IMMUNIZATIONS:   - Tdap: Tetanus vaccination status reviewed: last tetanus booster within 10 years. - Influenza: Up to date  SCREENING: -Mammogram: Up to date  - Colonoscopy: Up to date   PATIENT COUNSELING:   Advised to take 1 mg of folate supplement per day if capable of pregnancy.   Sexuality: Discussed sexually transmitted diseases, partner selection, use of condoms, avoidance of unintended pregnancy  and contraceptive alternatives.   Advised to avoid cigarette smoking.  I discussed with the patient that most people either abstain from alcohol or drink within safe limits (<=14/week and <=4 drinks/occasion for males, <=7/weeks and <= 3 drinks/occasion for females) and that the risk for alcohol disorders and other health effects rises proportionally with the number of drinks per week and how often a drinker exceeds daily limits.  Discussed cessation/primary prevention of drug  use and availability of treatment for abuse.   Diet: Encouraged to adjust caloric intake to maintain  or achieve ideal body weight, to reduce intake of dietary saturated fat and total fat, to limit sodium intake by avoiding high sodium foods and not adding table salt, and to maintain adequate dietary potassium and calcium preferably from fresh fruits, vegetables, and low-fat dairy products.    stressed the importance of regular exercise  Injury prevention: Discussed safety belts, safety helmets, smoke detector, smoking near bedding or upholstery.   Dental health: Discussed importance of regular tooth brushing, flossing, and dental visits.    NEXT PREVENTATIVE PHYSICAL DUE IN 1 YEAR. Return in about 1 year (around 08/08/2019) for CPE.

## 2018-08-08 ENCOUNTER — Encounter: Payer: Self-pay | Admitting: Family Medicine

## 2018-08-08 LAB — TSH: TSH: 1.8 u[IU]/mL (ref 0.450–4.500)

## 2018-08-08 LAB — COMPREHENSIVE METABOLIC PANEL
ALT: 25 IU/L (ref 0–32)
AST: 20 IU/L (ref 0–40)
Albumin/Globulin Ratio: 1.7 (ref 1.2–2.2)
Albumin: 4.1 g/dL (ref 3.8–4.9)
Alkaline Phosphatase: 84 IU/L (ref 39–117)
BILIRUBIN TOTAL: 0.5 mg/dL (ref 0.0–1.2)
BUN/Creatinine Ratio: 14 (ref 9–23)
BUN: 14 mg/dL (ref 6–24)
CHLORIDE: 101 mmol/L (ref 96–106)
CO2: 24 mmol/L (ref 20–29)
Calcium: 9.4 mg/dL (ref 8.7–10.2)
Creatinine, Ser: 0.97 mg/dL (ref 0.57–1.00)
GFR calc Af Amer: 77 mL/min/{1.73_m2} (ref >59)
GFR calc non Af Amer: 66 mL/min/{1.73_m2} (ref >59)
Globulin, Total: 2.4 g/dL (ref 1.5–4.5)
Glucose: 85 mg/dL (ref 65–99)
Potassium: 4.1 mmol/L (ref 3.5–5.2)
Sodium: 141 mmol/L (ref 134–144)
Total Protein: 6.5 g/dL (ref 6.0–8.5)

## 2018-08-08 LAB — CBC WITH DIFFERENTIAL/PLATELET
Basophils Absolute: 0 10*3/uL (ref 0.0–0.2)
Basos: 1 %
EOS (ABSOLUTE): 0.1 10*3/uL (ref 0.0–0.4)
Eos: 1 %
Hematocrit: 44 % (ref 34.0–46.6)
Hemoglobin: 15.4 g/dL (ref 11.1–15.9)
Immature Grans (Abs): 0 10*3/uL (ref 0.0–0.1)
Immature Granulocytes: 0 %
Lymphocytes Absolute: 1.5 10*3/uL (ref 0.7–3.1)
Lymphs: 26 %
MCH: 31.2 pg (ref 26.6–33.0)
MCHC: 35 g/dL (ref 31.5–35.7)
MCV: 89 fL (ref 79–97)
Monocytes Absolute: 0.5 10*3/uL (ref 0.1–0.9)
Monocytes: 9 %
NEUTROS ABS: 3.7 10*3/uL (ref 1.4–7.0)
Neutrophils: 63 %
Platelets: 241 10*3/uL (ref 150–450)
RBC: 4.94 x10E6/uL (ref 3.77–5.28)
RDW: 12.4 % (ref 11.7–15.4)
WBC: 5.8 10*3/uL (ref 3.4–10.8)

## 2018-08-08 LAB — LIPID PANEL W/O CHOL/HDL RATIO
Cholesterol, Total: 194 mg/dL (ref 100–199)
HDL: 43 mg/dL (ref >39)
LDL Calculated: 130 mg/dL — ABNORMAL HIGH (ref 0–99)
Triglycerides: 104 mg/dL (ref 0–149)
VLDL Cholesterol Cal: 21 mg/dL (ref 5–40)

## 2018-08-16 ENCOUNTER — Ambulatory Visit (INDEPENDENT_AMBULATORY_CARE_PROVIDER_SITE_OTHER): Payer: Managed Care, Other (non HMO) | Admitting: Obstetrics and Gynecology

## 2018-08-16 ENCOUNTER — Encounter: Payer: Self-pay | Admitting: Obstetrics and Gynecology

## 2018-08-16 VITALS — BP 140/90 | HR 80 | Ht 69.0 in | Wt 173.0 lb

## 2018-08-16 DIAGNOSIS — Z01419 Encounter for gynecological examination (general) (routine) without abnormal findings: Secondary | ICD-10-CM

## 2018-08-16 DIAGNOSIS — Z1239 Encounter for other screening for malignant neoplasm of breast: Secondary | ICD-10-CM

## 2018-08-16 NOTE — Progress Notes (Signed)
Gynecology Annual Exam  PCP: Volney American, PA-C  Chief Complaint:  Chief Complaint  Patient presents with  . Gynecologic Exam    History of Present Illness:Patient is a 53 y.o. [id number] presents for annual exam. The patient has no complaints today.   LMP: No LMP recorded. Patient has had an ablation.  The patient is sexually active. She denies dyspareunia.  The patient does perform self breast exams.  There is no notable family history of breast or ovarian cancer in her family.  The patient wears seatbelts: yes.   The patient has regular exercise: not asked.    The patient denies current symptoms of depression.     Review of Systems: Review of Systems  Constitutional: Negative for chills and fever.  HENT: Negative for congestion.   Respiratory: Negative for cough and shortness of breath.   Cardiovascular: Negative for chest pain and palpitations.  Gastrointestinal: Negative for abdominal pain, constipation, diarrhea, heartburn, nausea and vomiting.  Genitourinary: Negative for dysuria, frequency and urgency.  Skin: Negative for itching and rash.  Neurological: Negative for dizziness and headaches.  Endo/Heme/Allergies: Negative for polydipsia.  Psychiatric/Behavioral: Negative for depression.    Past Medical History:  Past Medical History:  Diagnosis Date  . GERD (gastroesophageal reflux disease)    occ  . Stress     Past Surgical History:  Past Surgical History:  Procedure Laterality Date  . CESAREAN SECTION    . DILATION AND CURETTAGE OF UTERUS    . FOOT SURGERY    . HALLUX VALGUS CORRECTION    . KNEE ARTHROSCOPY WITH MEDIAL MENISECTOMY Right 06/13/2018   Procedure: KNEE ARTHROSCOPY WITH MEDIAL MENISECTOMY;  Surgeon: Thornton Park, MD;  Location: [facility] ORS;  Service: Orthopedics;  Laterality: Right;  . TOE SURGERY    . TONSILLECTOMY AND ADENOIDECTOMY      Gynecologic History:  No LMP recorded. Patient has had an ablation. Last Pap: Results  were: 07/24/2016 NIL (plain pap)  Last mammogram: 08/01/2017 Results were: Gillian Shields I  Obstetric History: [id number]  Family History:  Family History  Problem Relation Age of Onset  . Stroke Mother   . Hypertension Mother   . Cancer Mother        Lung  . Diabetes Father     Social History:  Social History   Socioeconomic History  . Marital status: Married    Spouse name: Not on file  . Number of children: Not on file  . Years of education: Not on file  . Highest education level: Not on file  Occupational History  . Not on file  Social Needs  . Financial resource strain: Not on file  . Food insecurity:    Worry: Not on file    Inability: Not on file  . Transportation needs:    Medical: Not on file    Non-medical: Not on file  Tobacco Use  . Smoking status: Former Smoker    Years: 20.00    Types: Cigarettes  . Smokeless tobacco: Never Used  . Tobacco comment: very light smoker  Substance and Sexual Activity  . Alcohol use: No    Alcohol/week: 0.0 standard drinks  . Drug use: No  . Sexual activity: Yes    Birth control/protection: None  Lifestyle  . Physical activity:    Days per week: 6 days    Minutes per session: 40 min  . Stress: Only a little  Relationships  . Social connections:    Talks on phone: More than  three times a week    Gets together: Twice a week    Attends religious service: More than 4 times per year    Active member of club or organization: No    Attends meetings of clubs or organizations: Never    Relationship status: Married  . Intimate partner violence:    Fear of current or ex partner: No    Emotionally abused: No    Physically abused: No    Forced sexual activity: No  Other Topics Concern  . 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 Authorizing Provider  acetaminophen (TYLENOL) 500 MG tablet Take 500 mg by mouth every 6 (six) hours as needed.   Yes [provider]  aspirin EC 325 MG tablet Take 1 tablet (325 mg total) by mouth daily. 06/13/18  Yes Thornton Park, MD  Biotin w/ Vitamins C & E (HAIR/SKIN/NAILS PO) Take 1 Dose by mouth.   Yes [provider]  Calcium Carbonate (CALCIUM 600 PO) Take 600 mg by mouth daily.    Yes [provider]  ibuprofen (ADVIL,MOTRIN) 800 MG tablet Take 1 tablet (800 mg total) by mouth every 8 (eight) hours as needed (for pain.). 08/07/18  Yes Volney American, PA-C  POTASSIUM PO Take 1 tablet by mouth daily.    Yes [provider]  ranitidine (ZANTAC) 150 MG capsule Take 150 mg by mouth as needed for heartburn.   Yes [provider]    Physical Exam Vitals: Blood pressure 140/90, pulse 80, height 5\' 9"  (1.753 m), weight 173 lb (78.5 kg).  General: NAD HEENT: normocephalic, anicteric Thyroid: no enlargement, no palpable nodules Pulmonary: No increased work of breathing, CTAB Cardiovascular: RRR, distal pulses 2+ Breast: Breast symmetrical, no tenderness, no palpable nodules or masses, no skin or nipple retraction present, no nipple discharge.  No axillary or supraclavicular lymphadenopathy. Abdomen: NABS, soft, non-tender, non-distended.  Umbilicus without lesions.  No hepatomegaly, splenomegaly or masses palpable. No evidence of hernia  Genitourinary:  External: Normal external female genitalia.  Normal urethral meatus, normal Bartholin's and Skene's glands.    Vagina: Normal vaginal mucosa, no evidence of prolapse.    Cervix: Grossly normal in appearance, no bleeding  Uterus: Non-enlarged, mobile, normal contour.  No CMT  Adnexa: ovaries non-enlarged, no adnexal masses  Rectal: deferred  Lymphatic: no evidence of inguinal lymphadenopathy Extremities: no edema, erythema, or tenderness Neurologic: Grossly  intact Psychiatric: mood appropriate, affect full  Female chaperone present for pelvic and breast  portions of the physical exam     Assessment: 53 y.o. [id number] routine annual exam  Plan: Problem List Items Addressed This Visit    None    Visit Diagnoses    Encounter for gynecological examination without abnormal finding    -  Primary   Breast screening          1) Mammogram - recommend yearly screening mammogram.  Mammogram Was ordered today  2) STI screening  was notoffered and therefore not obtained  3) ASCCP guidelines and rational discussed.  Patient opts for every 3 years screening interval  4) Osteoporosis  - per USPTF routine screening DEXA at age 18  5) Routine healthcare maintenance including cholesterol, diabetes screening discussed managed by PCP  6) Colonoscopy UTD 2015, repeat recommended 07/2023   7) Return in about 1 year (around 08/17/2019) for annual.    Malachy Mood, MD Mosetta Pigeon, [facility] 08/16/2018, 2:43 PM

## 2018-08-16 NOTE — Patient Instructions (Signed)
[facility] [address] [place] [address]  [facility] [place]  [address] [place] [address]  Phone: [phone]  

## 2018-08-22 ENCOUNTER — Telehealth: Payer: Self-pay | Admitting: Family Medicine

## 2018-08-22 NOTE — Telephone Encounter (Signed)
Left message on machine for pt to return call to the office. CRM updated.  

## 2018-08-22 NOTE — Telephone Encounter (Signed)
[phone]

## 2018-08-22 NOTE — Telephone Encounter (Signed)
Copied from [place] [id number]. Topic: General - Other >> Aug 22, 2018 11:39 AM Sheran Luz wrote: Reason for CRM: Patient called to inquire if lab results from 2/10 can be faxed to her, as she has misplaced original copy after receiving it in the mail. Please advise.

## 2018-08-23 NOTE — Telephone Encounter (Signed)
Labs printed and faxed

## 2018-09-11 ENCOUNTER — Other Ambulatory Visit: Payer: Self-pay

## 2018-09-11 ENCOUNTER — Ambulatory Visit: Payer: Managed Care, Other (non HMO) | Admitting: Family Medicine

## 2018-09-11 ENCOUNTER — Encounter: Payer: Self-pay | Admitting: Family Medicine

## 2018-09-11 VITALS — BP 142/95 | HR 75 | Temp 98.1°F

## 2018-09-11 DIAGNOSIS — J309 Allergic rhinitis, unspecified: Secondary | ICD-10-CM | POA: Insufficient documentation

## 2018-09-11 DIAGNOSIS — H01132 Eczematous dermatitis of right lower eyelid: Secondary | ICD-10-CM | POA: Diagnosis not present

## 2018-09-11 DIAGNOSIS — H01131 Eczematous dermatitis of right upper eyelid: Secondary | ICD-10-CM | POA: Diagnosis not present

## 2018-09-11 DIAGNOSIS — H01134 Eczematous dermatitis of left upper eyelid: Secondary | ICD-10-CM

## 2018-09-11 DIAGNOSIS — H5789 Other specified disorders of eye and adnexa: Secondary | ICD-10-CM | POA: Diagnosis not present

## 2018-09-11 DIAGNOSIS — J3089 Other allergic rhinitis: Secondary | ICD-10-CM | POA: Diagnosis not present

## 2018-09-11 DIAGNOSIS — H01135 Eczematous dermatitis of left lower eyelid: Secondary | ICD-10-CM

## 2018-09-11 MED ORDER — OFLOXACIN 0.3 % OP SOLN: 1.0000 [drp] | Freq: Four times a day (QID) | OPHTHALMIC | 0 refills | Status: DC

## 2018-09-11 MED ORDER — CRISABOROLE 2 % EX OINT: 1.0000 "application " | TOPICAL_OINTMENT | Freq: Two times a day (BID) | CUTANEOUS | 0 refills | Status: DC

## 2018-09-11 NOTE — Assessment & Plan Note (Signed)
BID antihistamine, allergy eye drops. F/u if sxs not improving

## 2018-09-11 NOTE — Progress Notes (Signed)
BP (!) 142/95 (BP Location: Right Arm, Patient Position: Sitting, Cuff Size: Normal)   Pulse 75   Temp 98.1 F (36.7 C)   SpO2 97%    Subjective:    Patient ID: Tiffany Mata, female    DOB: 24-Mar-1966, 53 y.o.   MRN: [id number]  HPI: Tiffany Mata is a 53 y.o. female  Chief Complaint  Patient presents with  . Eye Problem    Pain, redness and watering, tried Claritin, otc eye drops   Here today with 1 month of itchy, red, irritated eyes that drain constantly. Now also having some redness and inflammation in the skin around both eyes. No new exposures, does not wear makeup regularly and has not changed hygiene products. Has been taking claritin and OTC allergy eye drops with minimal relief. Some sneezing, post nasal drainage the past month as well. Denies fevers, chills, CP, SOB, body aches, visual changes, headaches. Known hx of allergies.   Relevant past medical, surgical, family and social history reviewed and updated as indicated. Interim medical history since our last visit reviewed. Allergies and medications reviewed and updated.  Review of Systems  Per HPI unless specifically indicated above     Objective:    BP (!) 142/95 (BP Location: Right Arm, Patient Position: Sitting, Cuff Size: Normal)   Pulse 75   Temp 98.1 F (36.7 C)   SpO2 97%   Wt Readings from Last 3 Encounters:  08/16/18 173 lb (78.5 kg)  08/07/18 167 lb 3.2 oz (75.8 kg)  06/13/18 165 lb (74.8 kg)    Physical Exam Vitals signs and nursing note reviewed.  Constitutional:      Appearance: Normal appearance. She is not ill-appearing.  HENT:     Head: Atraumatic.     Right Ear: Tympanic membrane normal.     Left Ear: Tympanic membrane normal.     Nose: Rhinorrhea present.     Mouth/Throat:     Mouth: Mucous membranes are moist.     Pharynx: Posterior oropharyngeal erythema present.  Eyes:     General:        Right eye: Discharge present.        Left eye: Discharge present.    Extraocular  Movements: Extraocular movements intact.     Conjunctiva/sclera: Conjunctivae normal.     Comments: Conjunctiva erythematous b/l, left worse than right No foreign body noted on exam  Neck:     Musculoskeletal: Normal range of motion and neck supple.  Cardiovascular:     Rate and Rhythm: Normal rate and regular rhythm.     Heart sounds: Normal heart sounds.  Pulmonary:     Effort: Pulmonary effort is normal.     Breath sounds: Normal breath sounds. No wheezing or rales.  Musculoskeletal: Normal range of motion.  Skin:    General: Skin is warm and dry.     Comments: Erythematous and edematous periorbital skin b/l  Neurological:     Mental Status: She is alert and oriented to person, place, and time.  Psychiatric:        Mood and Affect: Mood normal.        Thought Content: Thought content normal.        Judgment: Judgment normal.      Declines visual acuity testing  Results for orders placed or performed in visit on 08/07/18  Microscopic Examination  Result Value Ref Range   WBC, UA 0-5 0 - 5 /hpf   RBC, UA 3-10 (A) 0 -  2 /hpf   Epithelial Cells (non renal) 0-10 0 - 10 /hpf   Mucus, UA Present (A) Not Estab.   Bacteria, UA Few (A) None seen/Few  CBC with Differential/Platelet  Result Value Ref Range   WBC 5.8 3.4 - 10.8 x10E3/uL   RBC 4.94 3.77 - 5.28 x10E6/uL   Hemoglobin 15.4 11.1 - 15.9 g/dL   Hematocrit 44.0 34.0 - 46.6 %   MCV 89 79 - 97 fL   MCH 31.2 26.6 - 33.0 pg   MCHC 35.0 31.5 - 35.7 g/dL   RDW 12.4 11.7 - 15.4 %   Platelets 241 150 - 450 x10E3/uL   Neutrophils 63 Not Estab. %   Lymphs 26 Not Estab. %   Monocytes 9 Not Estab. %   Eos 1 Not Estab. %   Basos 1 Not Estab. %   Neutrophils Absolute 3.7 1.4 - 7.0 x10E3/uL   Lymphocytes Absolute 1.5 0.7 - 3.1 x10E3/uL   Monocytes Absolute 0.5 0.1 - 0.9 x10E3/uL   EOS (ABSOLUTE) 0.1 0.0 - 0.4 x10E3/uL   Basophils Absolute 0.0 0.0 - 0.2 x10E3/uL   Immature Granulocytes 0 Not Estab. %   Immature Grans (Abs)  0.0 0.0 - 0.1 x10E3/uL  Comprehensive metabolic panel  Result Value Ref Range   Glucose 85 65 - 99 mg/dL   BUN 14 6 - 24 mg/dL   Creatinine, Ser 0.97 0.57 - 1.00 mg/dL   GFR calc non Af Amer 66 >59 mL/min/1.73   GFR calc Af Amer 77 >59 mL/min/1.73   BUN/Creatinine Ratio 14 9 - 23   Sodium 141 134 - 144 mmol/L   Potassium 4.1 3.5 - 5.2 mmol/L   Chloride 101 96 - 106 mmol/L   CO2 24 20 - 29 mmol/L   Calcium 9.4 8.7 - 10.2 mg/dL   Total Protein 6.5 6.0 - 8.5 g/dL   Albumin 4.1 3.8 - 4.9 g/dL   Globulin, Total 2.4 1.5 - 4.5 g/dL   Albumin/Globulin Ratio 1.7 1.2 - 2.2   Bilirubin Total 0.5 0.0 - 1.2 mg/dL   Alkaline Phosphatase 84 39 - 117 IU/L   AST 20 0 - 40 IU/L   ALT 25 0 - 32 IU/L  Lipid Panel w/o Chol/HDL Ratio  Result Value Ref Range   Cholesterol, Total 194 100 - 199 mg/dL   Triglycerides 104 0 - 149 mg/dL   HDL 43 >39 mg/dL   VLDL Cholesterol Cal 21 5 - 40 mg/dL   LDL Calculated 130 (H) 0 - 99 mg/dL  UA/M w/rflx Culture, Routine  Result Value Ref Range   Specific Gravity, UA >1.030 (H) 1.005 - 1.030   pH, UA 5.0 5.0 - 7.5   Color, UA Yellow Yellow   Appearance Ur Clear Clear   Leukocytes, UA Negative Negative   Protein, UA Trace (A) Negative/Trace   Glucose, UA Negative Negative   Ketones, UA Negative Negative   RBC, UA 3+ (A) Negative   Bilirubin, UA Negative Negative   Urobilinogen, Ur 0.2 0.2 - 1.0 mg/dL   Nitrite, UA Negative Negative   Microscopic Examination See below:   TSH  Result Value Ref Range   TSH 1.800 0.450 - 4.500 uIU/mL      Assessment & Plan:   Problem List Items Addressed This Visit      Respiratory   Allergic rhinitis - Primary    BID antihistamine, allergy eye drops. F/u if sxs not improving       Other Visit Diagnoses  Eye redness       Continue antihistamine drops, add antibiotic drops in case secondary bacterial infection. F/u with Opthalmology if not improving or worsening   Eczematous dermatitis of upper and lower eyelids  of both eyes       Will tx with eucrisa and good unscented moisturizers BID for protection. Avoid makeup or any scented face products       Follow up plan: Return if symptoms worsen or fail to improve.

## 2018-11-28 ENCOUNTER — Other Ambulatory Visit: Payer: Self-pay | Admitting: Family Medicine

## 2018-11-28 ENCOUNTER — Encounter: Payer: Self-pay | Admitting: Family Medicine

## 2018-11-28 ENCOUNTER — Telehealth: Payer: Self-pay | Admitting: Family Medicine

## 2018-11-28 NOTE — Telephone Encounter (Signed)
Spoke with pt and she stated that the pharmacy did not get the prescription. Can this be sent in again or called in?

## 2018-11-28 NOTE — Telephone Encounter (Signed)
Pt requesting change of pharmacy Requested Prescriptions  Pending Prescriptions Disp Refills  . ibuprofen (ADVIL) 800 MG tablet [Pharmacy Med Name: IBUPROFEN 800MG  TABLETS] 60 tablet 8    Sig: TAKE 1 TABLET BY MOUTH EVERY 8 HOURS AS NEEDED     Analgesics:  NSAIDS Passed - 11/28/2018 10:29 AM      Passed - Cr in normal range and within 360 days    Creatinine, Ser  Date Value Ref Range Status  08/07/2018 0.97 0.57 - 1.00 mg/dL Final         Passed - HGB in normal range and within 360 days    Hemoglobin  Date Value Ref Range Status  08/07/2018 15.4 11.1 - 15.9 g/dL Final         Passed - Patient is not pregnant      Passed - Valid encounter within last 12 months    Recent Outpatient Visits          2 months ago Non-seasonal allergic rhinitis due to other allergic trigger   [facility] [address], [place]   3 months ago Annual physical exam   [place], [place]   7 months ago Sore throat   [facility], [place]   1 year ago Annual physical exam   [facility], [place]   1 year ago Chronic pain of right knee   [facility], [place]      Future Appointments            In 8 months Orene Desanctis, Lilia Argue, [facility], [place]

## 2018-11-28 NOTE — Telephone Encounter (Signed)
Copied from [phone]. Topic: Quick Communication - Rx Refill/Question >> Nov 28, 2018 10:52 AM Celene Kras A wrote: Medication: ibuprofen (ADVIL,MOTRIN) 800 MG tablet   Has the patient contacted their pharmacy? Yes.   (Agent: If no, request that the patient contact the pharmacy for the refill.) (Agent: If yes, when and what did the pharmacy advise?)  Preferred Pharmacy (with phone number or street name): [id number] [address], [place] [address] Phone: [phone] Fax: [phone] Not a 24 hour pharmacy; exact hours not known.    Agent: Please be advised that RX refills may take up to 3 business days. We ask that you follow-up with your pharmacy.

## 2018-11-28 NOTE — Telephone Encounter (Signed)
Looks like she got a new script in Feb with 1 year of refills

## 2018-11-29 MED ORDER — IBUPROFEN 800 MG PO TABS: 800.0000 mg | ORAL_TABLET | Freq: Three times a day (TID) | ORAL | 5 refills | Status: DC | PRN

## 2018-11-29 NOTE — Telephone Encounter (Signed)
Rx resent.

## 2019-06-12 ENCOUNTER — Other Ambulatory Visit: Payer: Self-pay | Admitting: Family Medicine

## 2019-06-12 ENCOUNTER — Ambulatory Visit (INDEPENDENT_AMBULATORY_CARE_PROVIDER_SITE_OTHER): Payer: Managed Care, Other (non HMO) | Admitting: Family Medicine

## 2019-06-12 ENCOUNTER — Encounter: Payer: Self-pay | Admitting: Family Medicine

## 2019-06-12 ENCOUNTER — Other Ambulatory Visit: Payer: Self-pay

## 2019-06-12 VITALS — Wt 166.0 lb

## 2019-06-12 DIAGNOSIS — R350 Frequency of micturition: Secondary | ICD-10-CM

## 2019-06-12 MED ORDER — SULFAMETHOXAZOLE-TRIMETHOPRIM 800-160 MG PO TABS: 1.0000 | ORAL_TABLET | Freq: Two times a day (BID) | ORAL | 0 refills | Status: DC

## 2019-06-12 NOTE — Progress Notes (Addendum)
Wt 166 lb (75.3 kg)   BMI 24.51 kg/m    Subjective:    Patient ID: Tiffany Mata, female    DOB: March 31, 1966, 53 y.o.   MRN: [id number]  HPI: Tiffany Mata is a 53 y.o. female  Chief Complaint  Patient presents with  . Urinary Frequency    x 2-3 days    . This visit was completed via WebEx due to the restrictions of the COVID-19 pandemic. All issues as above were discussed and addressed. Physical exam was done as above through visual confirmation on WebEx. If it was felt that the patient should be evaluated in the office, they were directed there. The patient verbally consented to this visit. . Location of the patient: parking lot . Location of the provider: home . Those involved with this call:  . Provider: Merrie Roof, PA-C . CMA: Lesle Chris, [place] . Front Desk/Registration: Jill Side  . Time spent on call: 15 minutes with patient face to face via video conference. More than 50% of this time was spent in counseling and coordination of care. 5 minutes total spent in review of patient's record and preparation of their chart. I verified patient identity using two factors (patient name and date of birth). Patient consents verbally to being seen via telemedicine visit today.   2-3 days of urinary frequency and urgency. Denies dysuria, hematuria, N/V/D, abdominal pain, fevers, concern for STIs. Not trying anything OTC for sxs. States hx of UTIs and that they get bad quickly for her so she gets nervous about them.   Relevant past medical, surgical, family and social history reviewed and updated as indicated. Interim medical history since our last visit reviewed. Allergies and medications reviewed and updated.  Review of Systems  Per HPI unless specifically indicated above     Objective:    Wt 166 lb (75.3 kg)   BMI 24.51 kg/m   Wt Readings from Last 3 Encounters:  06/12/19 166 lb (75.3 kg)  08/16/18 173 lb (78.5 kg)  08/07/18 167 lb 3.2 oz (75.8 kg)    Physical  Exam Vitals and nursing note reviewed.  Constitutional:      General: She is not in acute distress.    Appearance: Normal appearance.  HENT:     Head: Atraumatic.     Right Ear: External ear normal.     Left Ear: External ear normal.     Nose: Nose normal. No congestion.     Mouth/Throat:     Mouth: Mucous membranes are moist.     Pharynx: Oropharynx is clear. No posterior oropharyngeal erythema.  Eyes:     Extraocular Movements: Extraocular movements intact.     Conjunctiva/sclera: Conjunctivae normal.  Cardiovascular:     Comments: Unable to assess via virtual visit Pulmonary:     Effort: Pulmonary effort is normal. No respiratory distress.  Abdominal:     Comments: Unable to perform abdominal exam due to virtual nature of appt  Musculoskeletal:        General: Normal range of motion.     Cervical back: Normal range of motion.  Skin:    General: Skin is dry.     Findings: No erythema.  Neurological:     Mental Status: She is alert and oriented to person, place, and time.  Psychiatric:        Mood and Affect: Mood normal.        Thought Content: Thought content normal.        Judgment:  Judgment normal.     Results for orders placed or performed in visit on 08/07/18  Microscopic Examination   URINE  Result Value Ref Range   WBC, UA 0-5 0 - 5 /hpf   RBC, UA 3-10 (A) 0 - 2 /hpf   Epithelial Cells (non renal) 0-10 0 - 10 /hpf   Mucus, UA Present (A) Not Estab.   Bacteria, UA Few (A) None seen/Few  CBC with Differential/Platelet  Result Value Ref Range   WBC 5.8 3.4 - 10.8 x10E3/uL   RBC 4.94 3.77 - 5.28 x10E6/uL   Hemoglobin 15.4 11.1 - 15.9 g/dL   Hematocrit 44.0 34.0 - 46.6 %   MCV 89 79 - 97 fL   MCH 31.2 26.6 - 33.0 pg   MCHC 35.0 31.5 - 35.7 g/dL   RDW 12.4 11.7 - 15.4 %   Platelets 241 150 - 450 x10E3/uL   Neutrophils 63 Not Estab. %   Lymphs 26 Not Estab. %   Monocytes 9 Not Estab. %   Eos 1 Not Estab. %   Basos 1 Not Estab. %   Neutrophils Absolute  3.7 1.4 - 7.0 x10E3/uL   Lymphocytes Absolute 1.5 0.7 - 3.1 x10E3/uL   Monocytes Absolute 0.5 0.1 - 0.9 x10E3/uL   EOS (ABSOLUTE) 0.1 0.0 - 0.4 x10E3/uL   Basophils Absolute 0.0 0.0 - 0.2 x10E3/uL   Immature Granulocytes 0 Not Estab. %   Immature Grans (Abs) 0.0 0.0 - 0.1 x10E3/uL  Comprehensive metabolic panel  Result Value Ref Range   Glucose 85 65 - 99 mg/dL   BUN 14 6 - 24 mg/dL   Creatinine, Ser 0.97 0.57 - 1.00 mg/dL   GFR calc non Af Amer 66 >59 mL/min/1.73   GFR calc Af Amer 77 >59 mL/min/1.73   BUN/Creatinine Ratio 14 9 - 23   Sodium 141 134 - 144 mmol/L   Potassium 4.1 3.5 - 5.2 mmol/L   Chloride 101 96 - 106 mmol/L   CO2 24 20 - 29 mmol/L   Calcium 9.4 8.7 - 10.2 mg/dL   Total Protein 6.5 6.0 - 8.5 g/dL   Albumin 4.1 3.8 - 4.9 g/dL   Globulin, Total 2.4 1.5 - 4.5 g/dL   Albumin/Globulin Ratio 1.7 1.2 - 2.2   Bilirubin Total 0.5 0.0 - 1.2 mg/dL   Alkaline Phosphatase 84 39 - 117 IU/L   AST 20 0 - 40 IU/L   ALT 25 0 - 32 IU/L  Lipid Panel w/o Chol/HDL Ratio  Result Value Ref Range   Cholesterol, Total 194 100 - 199 mg/dL   Triglycerides 104 0 - 149 mg/dL   HDL 43 >39 mg/dL   VLDL Cholesterol Cal 21 5 - 40 mg/dL   LDL Calculated 130 (H) 0 - 99 mg/dL  UA/M w/rflx Culture, Routine   Specimen: Urine   URINE  Result Value Ref Range   Specific Gravity, UA >1.030 (H) 1.005 - 1.030   pH, UA 5.0 5.0 - 7.5   Color, UA Yellow Yellow   Appearance Ur Clear Clear   Leukocytes, UA Negative Negative   Protein, UA Trace (A) Negative/Trace   Glucose, UA Negative Negative   Ketones, UA Negative Negative   RBC, UA 3+ (A) Negative   Bilirubin, UA Negative Negative   Urobilinogen, Ur 0.2 0.2 - 1.0 mg/dL   Nitrite, UA Negative Negative   Microscopic Examination See below:   TSH  Result Value Ref Range   TSH 1.800 0.450 - 4.500 uIU/mL  Assessment & Plan:   Problem List Items Addressed This Visit    None    Visit Diagnoses    Urinary frequency    -  Primary   Await  U/A results, push fluids, AZO if burning. Will tx with abx if UTI present   Relevant Orders   UA/M w/rflx Culture, Routine       Follow up plan: Return if symptoms worsen or fail to improve.

## 2019-06-14 LAB — UA/M W/RFLX CULTURE, ROUTINE
Bilirubin, UA: NEGATIVE
Glucose, UA: NEGATIVE
Ketones, UA: NEGATIVE
Nitrite, UA: NEGATIVE
Protein,UA: NEGATIVE
Specific Gravity, UA: 1.02 (ref 1.005–1.030)
Urobilinogen, Ur: 0.2 mg/dL (ref 0.2–1.0)
pH, UA: 6 (ref 5.0–7.5)

## 2019-06-14 LAB — URINE CULTURE, REFLEX

## 2019-06-14 LAB — MICROSCOPIC EXAMINATION

## 2019-07-18 ENCOUNTER — Telehealth: Payer: Managed Care, Other (non HMO) | Admitting: Family Medicine

## 2019-08-03 ENCOUNTER — Telehealth: Payer: Self-pay

## 2019-08-03 ENCOUNTER — Other Ambulatory Visit: Payer: Self-pay | Admitting: Obstetrics and Gynecology

## 2019-08-03 ENCOUNTER — Ambulatory Visit (INDEPENDENT_AMBULATORY_CARE_PROVIDER_SITE_OTHER): Payer: Managed Care, Other (non HMO)

## 2019-08-03 ENCOUNTER — Other Ambulatory Visit: Payer: Self-pay

## 2019-08-03 DIAGNOSIS — R300 Dysuria: Secondary | ICD-10-CM | POA: Diagnosis not present

## 2019-08-03 DIAGNOSIS — R8290 Unspecified abnormal findings in urine: Secondary | ICD-10-CM

## 2019-08-03 LAB — POCT URINALYSIS DIPSTICK
Bilirubin, UA: NEGATIVE
Blood, UA: POSITIVE
Glucose, UA: NEGATIVE
Ketones, UA: NEGATIVE
Nitrite, UA: NEGATIVE
Protein, UA: NEGATIVE
Spec Grav, UA: 1.01 (ref 1.010–1.025)
Urobilinogen, UA: 0.2 E.U./dL
pH, UA: 5 (ref 5.0–8.0)

## 2019-08-03 MED ORDER — NITROFURANTOIN MONOHYD MACRO 100 MG PO CAPS: 100.0000 mg | ORAL_CAPSULE | Freq: Two times a day (BID) | ORAL | 0 refills | Status: AC

## 2019-08-03 NOTE — Telephone Encounter (Signed)
Left voice mail to advise pt to pick up rx from pharmacy

## 2019-08-03 NOTE — Telephone Encounter (Signed)
PT dropped off urine sample and long dip shows Leukocytes and blood, can you put order in for urine culture, and do you want to send in a rx or wait for results?

## 2019-08-03 NOTE — Telephone Encounter (Signed)
You can let her know I put in an Rx for macrobid to [facility] [address] and I put in an order for culture

## 2019-08-05 LAB — URINE CULTURE

## 2019-08-10 ENCOUNTER — Encounter: Payer: Self-pay | Admitting: Family Medicine

## 2019-08-10 ENCOUNTER — Ambulatory Visit (INDEPENDENT_AMBULATORY_CARE_PROVIDER_SITE_OTHER): Payer: Managed Care, Other (non HMO) | Admitting: Family Medicine

## 2019-08-10 ENCOUNTER — Other Ambulatory Visit: Payer: Self-pay

## 2019-08-10 VITALS — BP 128/84 | HR 76 | Temp 97.6°F | Ht 68.0 in | Wt 163.0 lb

## 2019-08-10 DIAGNOSIS — J3089 Other allergic rhinitis: Secondary | ICD-10-CM

## 2019-08-10 DIAGNOSIS — R0981 Nasal congestion: Secondary | ICD-10-CM

## 2019-08-10 DIAGNOSIS — Z1211 Encounter for screening for malignant neoplasm of colon: Secondary | ICD-10-CM | POA: Diagnosis not present

## 2019-08-10 DIAGNOSIS — Z0000 Encounter for general adult medical examination without abnormal findings: Secondary | ICD-10-CM

## 2019-08-10 LAB — MICROSCOPIC EXAMINATION

## 2019-08-10 LAB — UA/M W/RFLX CULTURE, ROUTINE
Bilirubin, UA: NEGATIVE
Glucose, UA: NEGATIVE
Ketones, UA: NEGATIVE
Leukocytes,UA: NEGATIVE
Nitrite, UA: NEGATIVE
Specific Gravity, UA: 1.025 (ref 1.005–1.030)
Urobilinogen, Ur: 0.2 mg/dL (ref 0.2–1.0)
pH, UA: 6 (ref 5.0–7.5)

## 2019-08-10 MED ORDER — AMOXICILLIN-POT CLAVULANATE 875-125 MG PO TABS: 1.0000 | ORAL_TABLET | Freq: Two times a day (BID) | ORAL | 0 refills | Status: DC

## 2019-08-10 NOTE — Patient Instructions (Signed)
[phone] -- call this number to schedule your COVID test

## 2019-08-10 NOTE — Progress Notes (Signed)
BP 128/84   Pulse 76   Temp 97.6 F (36.4 C) (Oral)   Ht 5\' 8"  (1.727 m)   Wt 163 lb (73.9 kg)   SpO2 99%   BMI 24.78 kg/m    Subjective:    Patient ID: Tiffany Mata, female    DOB: 05/13/1966, 54 y.o.   MRN: [id number]  HPI: Tiffany Mata is a 54 y.o. female presenting on 08/10/2019 for comprehensive medical examination. Current medical complaints include:see below  4-5 days of sinus congestion, ear pressure. Using neti pot, allergy pills, nasal spray. Denies fevers, chills, CP, SOB, sick contacts, recent travel.   She currently lives with: Menopausal Symptoms: no  Depression Screen done today and results listed below:  Depression screen [facility] 2/9 08/10/2019 08/05/2017  Decreased Interest 0 0  Down, Depressed, Hopeless 0 0  PHQ - 2 Score 0 0  Altered sleeping 0 0  Tired, decreased energy 0 0  Change in appetite 0 0  Feeling bad or failure about yourself  0 0  Trouble concentrating 0 0  Moving slowly or fidgety/restless 0 0  Suicidal thoughts 0 0  PHQ-9 Score 0 0    The patient does not have a history of falls. I did complete a risk assessment for falls. A plan of care for falls was documented.   Past Medical History:  Past Medical History:  Diagnosis Date  . GERD (gastroesophageal reflux disease)    occ  . Stress     Surgical History:  Past Surgical History:  Procedure Laterality Date  . CESAREAN SECTION    . DILATION AND CURETTAGE OF UTERUS    . FOOT SURGERY    . HALLUX VALGUS CORRECTION    . KNEE ARTHROSCOPY WITH MEDIAL MENISECTOMY Right 06/13/2018   Procedure: KNEE ARTHROSCOPY WITH MEDIAL MENISECTOMY;  Surgeon: Thornton Park, MD;  Location: [facility] ORS;  Service: Orthopedics;  Laterality: Right;  . REFRACTIVE SURGERY    . TOE SURGERY    . TONSILLECTOMY AND ADENOIDECTOMY      Medications:  Current Outpatient Medications on File Prior to Visit  Medication Sig  . acetaminophen (TYLENOL) 500 MG tablet Take 500 mg by mouth every 6 (six) hours as needed.  Marland Kitchen  aspirin EC 325 MG tablet Take 1 tablet (325 mg total) by mouth daily.  . Biotin w/ Vitamins C & E (HAIR/SKIN/NAILS PO) Take 1 Dose by mouth.  . Calcium Carbonate (CALCIUM 600 PO) Take 600 mg by mouth daily.   Marland Kitchen ibuprofen (ADVIL) 800 MG tablet Take 1 tablet (800 mg total) by mouth every 8 (eight) hours as needed.  . loratadine (CLARITIN) 10 MG tablet Take 10 mg by mouth daily.  Marland Kitchen POTASSIUM PO Take 1 tablet by mouth daily.    No current facility-administered medications on file prior to visit.    Allergies:  No Known Allergies  Social History:  Social History   Socioeconomic History  . Marital status: Married    Spouse name: Not on file  . Number of children: Not on file  . Years of education: Not on file  . Highest education level: Not on file  Occupational History  . Not on file  Tobacco Use  . Smoking status: Former Smoker    Years: 20.00    Types: Cigarettes  . Smokeless tobacco: Never Used  . Tobacco comment: very light smoker  Substance and Sexual Activity  . Alcohol use: No    Alcohol/week: 0.0 standard drinks  . Drug use: No  .  Sexual activity: Yes    Birth control/protection: None  Other Topics Concern  . Not on file  Social History Narrative   Married   2 daughters- ages 43 and 84   She works as a Art therapist, husband works for [employer]:   . Difficulty of Paying Living Expenses: Not on file  Food Insecurity:   . Worried About Charity fundraiser in the Last Year: Not on file  . Ran Out of Food in the Last Year: Not on file  Transportation Needs:   . Lack of Transportation (Medical): Not on file  . Lack of Transportation (Non-Medical): Not on file  Physical Activity:   . Days of Exercise per Week: Not on file  . Minutes of Exercise per Session: Not on file  Stress:   . Feeling of Stress : Not on file  Social Connections:   . Frequency of Communication with Friends and Family: Not on file  .  Frequency of Social Gatherings with Friends and Family: Not on file  . Attends Religious Services: Not on file  . Active Member of Clubs or Organizations: Not on file  . Attends Archivist Meetings: Not on file  . Marital Status: Not on file  Intimate Partner Violence:   . Fear of Current or Ex-Partner: Not on file  . Emotionally Abused: Not on file  . Physically Abused: Not on file  . Sexually Abused: Not on file   Social History   Tobacco Use  Smoking Status Former Smoker  . Years: 20.00  . Types: Cigarettes  Smokeless Tobacco Never Used  Tobacco Comment   very light smoker   Social History   Substance and Sexual Activity  Alcohol Use No  . Alcohol/week: 0.0 standard drinks    Family History:  Family History  Problem Relation Age of Onset  . Stroke Mother   . Hypertension Mother   . Cancer Mother        Lung  . Diabetes Father     Past medical history, surgical history, medications, allergies, family history and social history reviewed with patient today and changes made to appropriate areas of the chart.   Review of Systems - General ROS: negative Psychological ROS: negative Ophthalmic ROS: negative ENT ROS: positive for - nasal congestion and sinus pain Allergy and Immunology ROS: negative Hematological and Lymphatic ROS: negative Endocrine ROS: negative Breast ROS: negative for breast lumps Respiratory ROS: no cough, shortness of breath, or wheezing Cardiovascular ROS: no chest pain or dyspnea on exertion Gastrointestinal ROS: no abdominal pain, change in bowel habits, or black or bloody stools Genito-Urinary ROS: no dysuria, trouble voiding, or hematuria Musculoskeletal ROS: negative Neurological ROS: no TIA or stroke symptoms Dermatological ROS: negative All other ROS negative except what is listed above and in the HPI.      Objective:    BP 128/84   Pulse 76   Temp 97.6 F (36.4 C) (Oral)   Ht 5\' 8"  (1.727 m)   Wt 163 lb (73.9 kg)    SpO2 99%   BMI 24.78 kg/m   Wt Readings from Last 3 Encounters:  08/10/19 163 lb (73.9 kg)  06/12/19 166 lb (75.3 kg)  08/16/18 173 lb (78.5 kg)    Physical Exam  Results for orders placed or performed in visit on 08/10/19  Microscopic Examination   URINE  Result Value Ref Range   WBC, UA 0-5 0 - 5 /  hpf   RBC 3-10 (A) 0 - 2 /hpf   Epithelial Cells (non renal) 0-10 0 - 10 /hpf   Casts Present None seen /lpf   Cast Type Hyaline casts N/A   Mucus, UA Present Not Estab.   Bacteria, UA Few (A) None seen/Few  CBC with Differential/Platelet  Result Value Ref Range   WBC 6.2 3.4 - 10.8 x10E3/uL   RBC 5.01 3.77 - 5.28 x10E6/uL   Hemoglobin 15.4 11.1 - 15.9 g/dL   Hematocrit 45.6 34.0 - 46.6 %   MCV 91 79 - 97 fL   MCH 30.7 26.6 - 33.0 pg   MCHC 33.8 31.5 - 35.7 g/dL   RDW 12.3 11.7 - 15.4 %   Platelets 266 150 - 450 x10E3/uL   Neutrophils 63 Not Estab. %   Lymphs 26 Not Estab. %   Monocytes 8 Not Estab. %   Eos 2 Not Estab. %   Basos 1 Not Estab. %   Neutrophils Absolute 3.9 1.4 - 7.0 x10E3/uL   Lymphocytes Absolute 1.6 0.7 - 3.1 x10E3/uL   Monocytes Absolute 0.5 0.1 - 0.9 x10E3/uL   EOS (ABSOLUTE) 0.1 0.0 - 0.4 x10E3/uL   Basophils Absolute 0.0 0.0 - 0.2 x10E3/uL   Immature Granulocytes 0 Not Estab. %   Immature Grans (Abs) 0.0 0.0 - 0.1 x10E3/uL  Comprehensive metabolic panel  Result Value Ref Range   Glucose 82 65 - 99 mg/dL   BUN 11 6 - 24 mg/dL   Creatinine, Ser 0.89 0.57 - 1.00 mg/dL   GFR calc non Af Amer 73 >59 mL/min/1.73   GFR calc Af Amer 84 >59 mL/min/1.73   BUN/Creatinine Ratio 12 9 - 23   Sodium 141 134 - 144 mmol/L   Potassium 4.1 3.5 - 5.2 mmol/L   Chloride 104 96 - 106 mmol/L   CO2 23 20 - 29 mmol/L   Calcium 9.1 8.7 - 10.2 mg/dL   Total Protein 6.1 6.0 - 8.5 g/dL   Albumin 4.0 3.8 - 4.9 g/dL   Globulin, Total 2.1 1.5 - 4.5 g/dL   Albumin/Globulin Ratio 1.9 1.2 - 2.2   Bilirubin Total 0.4 0.0 - 1.2 mg/dL   Alkaline Phosphatase 77 39 - 117 IU/L    AST 22 0 - 40 IU/L   ALT 19 0 - 32 IU/L  Lipid Panel w/o Chol/HDL Ratio  Result Value Ref Range   Cholesterol, Total 189 100 - 199 mg/dL   Triglycerides 131 0 - 149 mg/dL   HDL 39 (L) >39 mg/dL   VLDL Cholesterol Cal 24 5 - 40 mg/dL   LDL Chol Calc (NIH) 126 (H) 0 - 99 mg/dL  TSH  Result Value Ref Range   TSH 1.900 0.450 - 4.500 uIU/mL  UA/M w/rflx Culture, Routine   Specimen: Urine   URINE  Result Value Ref Range   Specific Gravity, UA 1.025 1.005 - 1.030   pH, UA 6.0 5.0 - 7.5   Color, UA Yellow Yellow   Appearance Ur Clear Clear   Leukocytes,UA Negative Negative   Protein,UA Trace (A) Negative/Trace   Glucose, UA Negative Negative   Ketones, UA Negative Negative   RBC, UA 1+ (A) Negative   Bilirubin, UA Negative Negative   Urobilinogen, Ur 0.2 0.2 - 1.0 mg/dL   Nitrite, UA Negative Negative   Microscopic Examination See below:       Assessment & Plan:   Problem List Items Addressed This Visit      Respiratory   Allergic  rhinitis    Continue antihistamine, nasal spray regimen       Other Visit Diagnoses    Nasal congestion    -  Primary   Will refer for COVID testing, supportive care and OTC remedies reviewed. Augmentin sent if worsening over weekend. Isolate until results return   Relevant Orders   Novel Coronavirus, NAA ([facility])   Annual physical exam       Relevant Orders   CBC with Differential/Platelet (Completed)   Comprehensive metabolic panel (Completed)   Lipid Panel w/o Chol/HDL Ratio (Completed)   TSH (Completed)   UA/M w/rflx Culture, Routine (Completed)   Screening for colon cancer       Relevant Orders   Ambulatory referral to Gastroenterology       Follow up plan: Return in about 1 year (around 08/09/2020) for CPE.   LABORATORY TESTING:  - Pap smear: up to date  IMMUNIZATIONS:   - Tdap: Tetanus vaccination status reviewed: last tetanus booster within 10 years. - Influenza: Up to date  SCREENING: -Mammogram: Ordered today  -  Colonoscopy: Up to date   PATIENT COUNSELING:   Advised to take 1 mg of folate supplement per day if capable of pregnancy.   Sexuality: Discussed sexually transmitted diseases, partner selection, use of condoms, avoidance of unintended pregnancy  and contraceptive alternatives.   Advised to avoid cigarette smoking.  I discussed with the patient that most people either abstain from alcohol or drink within safe limits (<=14/week and <=4 drinks/occasion for males, <=7/weeks and <= 3 drinks/occasion for females) and that the risk for alcohol disorders and other health effects rises proportionally with the number of drinks per week and how often a drinker exceeds daily limits.  Discussed cessation/primary prevention of drug use and availability of treatment for abuse.   Diet: Encouraged to adjust caloric intake to maintain  or achieve ideal body weight, to reduce intake of dietary saturated fat and total fat, to limit sodium intake by avoiding high sodium foods and not adding table salt, and to maintain adequate dietary potassium and calcium preferably from fresh fruits, vegetables, and low-fat dairy products.    stressed the importance of regular exercise  Injury prevention: Discussed safety belts, safety helmets, smoke detector, smoking near bedding or upholstery.   Dental health: Discussed importance of regular tooth brushing, flossing, and dental visits.    NEXT PREVENTATIVE PHYSICAL DUE IN 1 YEAR. Return in about 1 year (around 08/09/2020) for CPE.

## 2019-08-11 LAB — COMPREHENSIVE METABOLIC PANEL
ALT: 19 IU/L (ref 0–32)
AST: 22 IU/L (ref 0–40)
Albumin/Globulin Ratio: 1.9 (ref 1.2–2.2)
Albumin: 4 g/dL (ref 3.8–4.9)
Alkaline Phosphatase: 77 IU/L (ref 39–117)
BUN/Creatinine Ratio: 12 (ref 9–23)
BUN: 11 mg/dL (ref 6–24)
Bilirubin Total: 0.4 mg/dL (ref 0.0–1.2)
CO2: 23 mmol/L (ref 20–29)
Calcium: 9.1 mg/dL (ref 8.7–10.2)
Chloride: 104 mmol/L (ref 96–106)
Creatinine, Ser: 0.89 mg/dL (ref 0.57–1.00)
GFR calc Af Amer: 84 mL/min/{1.73_m2} (ref >59)
GFR calc non Af Amer: 73 mL/min/{1.73_m2} (ref >59)
Globulin, Total: 2.1 g/dL (ref 1.5–4.5)
Glucose: 82 mg/dL (ref 65–99)
Potassium: 4.1 mmol/L (ref 3.5–5.2)
Sodium: 141 mmol/L (ref 134–144)
Total Protein: 6.1 g/dL (ref 6.0–8.5)

## 2019-08-11 LAB — CBC WITH DIFFERENTIAL/PLATELET
Basophils Absolute: 0 10*3/uL (ref 0.0–0.2)
Basos: 1 %
EOS (ABSOLUTE): 0.1 10*3/uL (ref 0.0–0.4)
Eos: 2 %
Hematocrit: 45.6 % (ref 34.0–46.6)
Hemoglobin: 15.4 g/dL (ref 11.1–15.9)
Immature Grans (Abs): 0 10*3/uL (ref 0.0–0.1)
Immature Granulocytes: 0 %
Lymphocytes Absolute: 1.6 10*3/uL (ref 0.7–3.1)
Lymphs: 26 %
MCH: 30.7 pg (ref 26.6–33.0)
MCHC: 33.8 g/dL (ref 31.5–35.7)
MCV: 91 fL (ref 79–97)
Monocytes Absolute: 0.5 10*3/uL (ref 0.1–0.9)
Monocytes: 8 %
Neutrophils Absolute: 3.9 10*3/uL (ref 1.4–7.0)
Neutrophils: 63 %
Platelets: 266 10*3/uL (ref 150–450)
RBC: 5.01 x10E6/uL (ref 3.77–5.28)
RDW: 12.3 % (ref 11.7–15.4)
WBC: 6.2 10*3/uL (ref 3.4–10.8)

## 2019-08-11 LAB — LIPID PANEL W/O CHOL/HDL RATIO
Cholesterol, Total: 189 mg/dL (ref 100–199)
HDL: 39 mg/dL — ABNORMAL LOW (ref >39)
LDL Chol Calc (NIH): 126 mg/dL — ABNORMAL HIGH (ref 0–99)
Triglycerides: 131 mg/dL (ref 0–149)
VLDL Cholesterol Cal: 24 mg/dL (ref 5–40)

## 2019-08-11 LAB — TSH: TSH: 1.9 u[IU]/mL (ref 0.450–4.500)

## 2019-08-13 NOTE — Assessment & Plan Note (Signed)
Continue antihistamine, nasal spray regimen

## 2019-08-14 ENCOUNTER — Telehealth: Payer: Self-pay

## 2019-08-14 ENCOUNTER — Other Ambulatory Visit: Payer: Self-pay

## 2019-08-14 DIAGNOSIS — Z1211 Encounter for screening for malignant neoplasm of colon: Secondary | ICD-10-CM

## 2019-08-14 NOTE — Telephone Encounter (Signed)
Gastroenterology Pre-Procedure Review  Request Date: Monday 09/10/19 Requesting Physician: Dr. Vicente Males  PATIENT REVIEW QUESTIONS: The patient responded to the following health history questions as indicated:    1. Are you having any GI issues? yes (some occasional constipation.  ) 2. Do you have a personal history of Polyps? no 3. Do you have a family history of Colon Cancer or Polyps? no 4. Diabetes Mellitus? no 5. Joint replacements in the past 12 months?no 6. Major health problems in the past 3 months?no 7. Any artificial heart valves, MVP, or defibrillator?no    MEDICATIONS & ALLERGIES:    Patient reports the following regarding taking any anticoagulation/antiplatelet therapy:   Plavix, Coumadin, Eliquis, Xarelto, Lovenox, Pradaxa, Brilinta, or Effient? no Aspirin? no  Patient confirms/reports the following medications:  Current Outpatient Medications  Medication Sig Dispense Refill  . acetaminophen (TYLENOL) 500 MG tablet Take 500 mg by mouth every 6 (six) hours as needed.    Marland Kitchen amoxicillin-clavulanate (AUGMENTIN) 875-125 MG tablet Take 1 tablet by mouth 2 (two) times daily. 14 tablet 0  . Biotin w/ Vitamins C & E (HAIR/SKIN/NAILS PO) Take 1 Dose by mouth.    . Calcium Carbonate (CALCIUM 600 PO) Take 600 mg by mouth daily.     Marland Kitchen ibuprofen (ADVIL) 800 MG tablet Take 1 tablet (800 mg total) by mouth every 8 (eight) hours as needed. 60 tablet 5  . loratadine (CLARITIN) 10 MG tablet Take 10 mg by mouth daily.    Marland Kitchen POTASSIUM PO Take 1 tablet by mouth daily.      No current facility-administered medications for this visit.    Patient confirms/reports the following allergies:  No Known Allergies  No orders of the defined types were placed in this encounter.   AUTHORIZATION INFORMATION Primary Insurance: 1D#: Group #:  Secondary Insurance: 1D#: Group #:  SCHEDULE INFORMATION: Date: Monday 09/10/19 Time: Location:[facility]

## 2019-08-21 ENCOUNTER — Ambulatory Visit (INDEPENDENT_AMBULATORY_CARE_PROVIDER_SITE_OTHER): Payer: Managed Care, Other (non HMO) | Admitting: Obstetrics and Gynecology

## 2019-08-21 ENCOUNTER — Other Ambulatory Visit (HOSPITAL_COMMUNITY)
Admission: RE | Admit: 2019-08-21 | Discharge: 2019-08-21 | Disposition: A | Discharge status: Home / Self Care | Payer: Managed Care, Other (non HMO) | Source: Ambulatory Visit | Attending: Obstetrics and Gynecology | Admitting: Obstetrics and Gynecology

## 2019-08-21 ENCOUNTER — Other Ambulatory Visit: Payer: Self-pay

## 2019-08-21 ENCOUNTER — Encounter: Payer: Self-pay | Admitting: Obstetrics and Gynecology

## 2019-08-21 VITALS — BP 134/96 | HR 79 | Ht 69.0 in | Wt 164.0 lb

## 2019-08-21 DIAGNOSIS — Z1239 Encounter for other screening for malignant neoplasm of breast: Secondary | ICD-10-CM

## 2019-08-21 DIAGNOSIS — Z124 Encounter for screening for malignant neoplasm of cervix: Secondary | ICD-10-CM | POA: Insufficient documentation

## 2019-08-21 DIAGNOSIS — Z01411 Encounter for gynecological examination (general) (routine) with abnormal findings: Secondary | ICD-10-CM | POA: Diagnosis not present

## 2019-08-21 DIAGNOSIS — N3001 Acute cystitis with hematuria: Secondary | ICD-10-CM | POA: Diagnosis not present

## 2019-08-21 DIAGNOSIS — Z01419 Encounter for gynecological examination (general) (routine) without abnormal findings: Secondary | ICD-10-CM

## 2019-08-21 DIAGNOSIS — R319 Hematuria, unspecified: Secondary | ICD-10-CM

## 2019-08-21 LAB — POCT URINALYSIS DIPSTICK
Bilirubin, UA: NEGATIVE
Glucose, UA: NEGATIVE
Ketones, UA: NEGATIVE
Nitrite, UA: NEGATIVE
Protein, UA: POSITIVE — AB
Spec Grav, UA: 1.015 (ref 1.010–1.025)
Urobilinogen, UA: 0.2 E.U./dL
pH, UA: 6 (ref 5.0–8.0)

## 2019-08-21 MED ORDER — NITROFURANTOIN MONOHYD MACRO 100 MG PO CAPS: 100.0000 mg | ORAL_CAPSULE | Freq: Two times a day (BID) | ORAL | 1 refills | Status: DC

## 2019-08-21 NOTE — Patient Instructions (Signed)
[facility] [address] [place] [address]  [facility] [place]  [address] [place] [address]  Phone: [phone]  

## 2019-08-21 NOTE — Progress Notes (Signed)
Gynecology Annual Exam  PCP: Volney American, PA-C  Chief Complaint:  Chief Complaint  Patient presents with  . Gynecologic Exam    reoccuring UTI's    History of Present Illness:Patient is a 54 y.o. [id number] presents for annual exam. The patient has no complaints today.   LMP: No LMP recorded. Patient has had an ablation. No PMB  The patient is sexually active. She denies dyspareunia.  The patient does not perform self breast exams.  There is no notable family history of breast or ovarian cancer in her family.  The patient wears seatbelts: yes.   The patient has regular exercise: not asked.    The patient denies current symptoms of depression.     Patient report recurrent UTI's with gross hematuria.  Was recently treated by PCP but now with recurrence.    Review of Systems: Review of Systems  Constitutional: Negative for chills and fever.  HENT: Negative for congestion.   Respiratory: Negative for cough and shortness of breath.   Cardiovascular: Negative for chest pain and palpitations.  Gastrointestinal: Negative for abdominal pain, constipation, diarrhea, heartburn, nausea and vomiting.  Genitourinary: Negative for dysuria, frequency and urgency.  Skin: Negative for itching and rash.  Neurological: Negative for dizziness and headaches.  Endo/Heme/Allergies: Negative for polydipsia.  Psychiatric/Behavioral: Negative for depression.    Past Medical History:  Past Medical History:  Diagnosis Date  . GERD (gastroesophageal reflux disease)    occ  . Stress     Past Surgical History:  Past Surgical History:  Procedure Laterality Date  . CESAREAN SECTION    . DILATION AND CURETTAGE OF UTERUS    . FOOT SURGERY    . HALLUX VALGUS CORRECTION    . KNEE ARTHROSCOPY WITH MEDIAL MENISECTOMY Right 06/13/2018   Procedure: KNEE ARTHROSCOPY WITH MEDIAL MENISECTOMY;  Surgeon: Thornton Park, MD;  Location: [facility] ORS;  Service: Orthopedics;  Laterality: Right;  .  REFRACTIVE SURGERY    . TOE SURGERY    . TONSILLECTOMY AND ADENOIDECTOMY      Gynecologic History:  No LMP recorded. Patient has had an ablation. Last Pap: Results were: 07/24/2016 NIL and HR HPV negative  Last mammogram: 08/01/2017 Results were: Gillian Shields I  Obstetric [id number]  Family History:  Family History  Problem Relation Age of Onset  . Stroke Mother   . Hypertension Mother   . Cancer Mother        Lung  . Diabetes Father     Social History:  Social History   Socioeconomic History  . Marital status: Married    Spouse name: Not on file  . Number of children: Not on file  . Years of education: Not on file  . Highest education level: Not on file  Occupational History  . Not on file  Tobacco Use  . Smoking status: Former Smoker    Years: 20.00    Types: Cigarettes  . Smokeless tobacco: Never Used  . Tobacco comment: very light smoker  Substance and Sexual Activity  . Alcohol use: No    Alcohol/week: 0.0 standard drinks  . Drug use: No  . Sexual activity: Yes    Birth control/protection: None    Comment: Ablation  Other Topics Concern  . 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  Difficulty of Paying Living Expenses: Not on file  Food Insecurity:   . Worried About Charity fundraiser in the Last Year: Not on file  . Ran Out of Food in the Last Year: Not on file  Transportation Needs:   . Lack of Transportation (Medical): Not on file  . Lack of Transportation (Non-Medical): Not on file  Physical Activity:   . Days of Exercise per Week: Not on file  . Minutes of Exercise per Session: Not on file  Stress:   . Feeling of Stress : Not on file  Social Connections:   . Frequency of Communication with Friends and Family: Not on file  . Frequency of Social Gatherings with Friends and Family: Not on  file  . Attends Religious Services: Not on file  . Active Member of Clubs or Organizations: Not on file  . Attends Archivist Meetings: Not on file  . Marital Status: Not on file  Intimate Partner Violence:   . Fear of Current or Ex-Partner: Not on file  . Emotionally Abused: Not on file  . Physically Abused: Not on file  . Sexually Abused: Not on file    Allergies:  No Known Allergies  Medications: Prior to Admission medications   Medication Sig Start Date End Date Taking? Authorizing Provider  Biotin w/ Vitamins C & E (HAIR/SKIN/NAILS PO) Take 1 Dose by mouth.   Yes [provider]  Calcium Carbonate (CALCIUM 600 PO) Take 600 mg by mouth daily.    Yes [provider]  ibuprofen (ADVIL) 800 MG tablet Take 1 tablet (800 mg total) by mouth every 8 (eight) hours as needed. 11/29/18  Yes Volney American, PA-C  loratadine (CLARITIN) 10 MG tablet Take 10 mg by mouth daily.   Yes [provider]  POTASSIUM PO Take 1 tablet by mouth daily.    Yes [provider]    Physical Exam Vitals: Blood pressure (!) 134/96, pulse 79, height 5\' 9"  (1.753 m), weight 164 lb (74.4 kg).  General: NAD HEENT: normocephalic, anicteric Thyroid: no enlargement, no palpable nodules Pulmonary: No increased work of breathing, CTAB Cardiovascular: RRR, distal pulses 2+ Breast: Breast symmetrical, no tenderness, no palpable nodules or masses, no skin or nipple retraction present, no nipple discharge.  No axillary or supraclavicular lymphadenopathy. Abdomen: NABS, soft, non-tender, non-distended.  Umbilicus without lesions.  No hepatomegaly, splenomegaly or masses palpable. No evidence of hernia  Genitourinary:  External: Normal external female genitalia.  Normal urethral meatus, normal Bartholin's and Skene's glands.    Vagina: Normal vaginal mucosa, no evidence of prolapse.    Cervix: Grossly normal in appearance, no bleeding  Uterus: Non-enlarged, mobile,  normal contour.  No CMT  Adnexa: ovaries non-enlarged, no adnexal masses  Rectal: deferred  Lymphatic: no evidence of inguinal lymphadenopathy Extremities: no edema, erythema, or tenderness Neurologic: Grossly intact Psychiatric: mood appropriate, affect full  Female chaperone present for pelvic and breast  portions of the physical exam     Assessment: 55 y.o. [id number] routine annual exam  Plan: Problem List Items Addressed This Visit    None    Visit Diagnoses    Hematuria, unspecified type    -  Primary   Relevant Orders   POCT urinalysis dipstick (Completed)   Urine Culture   Acute cystitis with hematuria       Relevant Orders   POCT urinalysis dipstick (Completed)   Urine Culture   Encounter for gynecological examination without abnormal finding  Screening for malignant neoplasm of cervix       Relevant Orders   Cytology - PAP   Breast screening       Relevant Orders   MM 3D SCREEN BREAST BILATERAL      1) Mammogram - recommend yearly screening mammogram.  Mammogram Was ordered today  2) STI screening  was notoffered and therefore not obtained  3) ASCCP guidelines and rational discussed.  Patient opts for every 3 years screening interval  4) Osteoporosis  - per USPTF routine screening DEXA at age 2  5) Routine healthcare maintenance including cholesterol, diabetes screening discussed managed by PCP  6) Colonoscopy scheduled for 09/10/2019  7) Acute cystitis with hematuria - Rx macrobid.  If negative culture will need work up for hematuria   8) Return in about 1 year (around 08/20/2020) for annual.    Malachy Mood, MD Mosetta Pigeon, [facility] 08/21/2019, 9:02 AM

## 2019-08-22 LAB — CYTOLOGY - PAP
Comment: NEGATIVE
Diagnosis: NEGATIVE
High risk HPV: NEGATIVE

## 2019-08-23 ENCOUNTER — Other Ambulatory Visit: Payer: Self-pay | Admitting: Obstetrics and Gynecology

## 2019-08-23 DIAGNOSIS — R310 Gross hematuria: Secondary | ICD-10-CM

## 2019-08-23 LAB — URINE CULTURE

## 2019-09-06 ENCOUNTER — Other Ambulatory Visit: Payer: Self-pay

## 2019-09-06 ENCOUNTER — Other Ambulatory Visit
Admission: RE | Admit: 2019-09-06 | Discharge: 2019-09-06 | Disposition: A | Discharge status: Home / Self Care | Payer: Managed Care, Other (non HMO) | Source: Ambulatory Visit | Attending: Gastroenterology | Admitting: Gastroenterology

## 2019-09-06 DIAGNOSIS — Z01812 Encounter for preprocedural laboratory examination: Secondary | ICD-10-CM | POA: Insufficient documentation

## 2019-09-06 DIAGNOSIS — Z20822 Contact with and (suspected) exposure to covid-19: Secondary | ICD-10-CM | POA: Insufficient documentation

## 2019-09-06 LAB — SARS CORONAVIRUS 2 (TAT 6-24 HRS): SARS Coronavirus 2: NEGATIVE

## 2019-09-07 ENCOUNTER — Other Ambulatory Visit: Payer: Self-pay

## 2019-09-07 ENCOUNTER — Encounter: Payer: Self-pay | Admitting: Gastroenterology

## 2019-09-07 MED ORDER — NA SULFATE-K SULFATE-MG SULF 17.5-3.13-1.6 GM/177ML PO SOLN: 1.0000 | Freq: Once | ORAL | 0 refills | Status: AC

## 2019-09-10 ENCOUNTER — Ambulatory Visit
Admission: RE | Admit: 2019-09-10 | Discharge: 2019-09-10 | Disposition: A | Discharge status: Home / Self Care | Payer: Managed Care, Other (non HMO) | Source: Ambulatory Visit | Attending: Gastroenterology | Admitting: Gastroenterology

## 2019-09-10 ENCOUNTER — Ambulatory Visit: Payer: Managed Care, Other (non HMO) | Admitting: Certified Registered Nurse Anesthetist

## 2019-09-10 ENCOUNTER — Encounter: Payer: Self-pay | Admitting: Gastroenterology

## 2019-09-10 ENCOUNTER — Encounter
Admission: RE | Disposition: A | Discharge status: Home / Self Care | Payer: Self-pay | Source: Ambulatory Visit | Attending: Gastroenterology

## 2019-09-10 DIAGNOSIS — Z86010 Personal history of colonic polyps: Secondary | ICD-10-CM

## 2019-09-10 DIAGNOSIS — Z87891 Personal history of nicotine dependence: Secondary | ICD-10-CM | POA: Insufficient documentation

## 2019-09-10 DIAGNOSIS — Z79899 Other long term (current) drug therapy: Secondary | ICD-10-CM | POA: Diagnosis not present

## 2019-09-10 DIAGNOSIS — D120 Benign neoplasm of cecum: Secondary | ICD-10-CM | POA: Diagnosis not present

## 2019-09-10 DIAGNOSIS — K219 Gastro-esophageal reflux disease without esophagitis: Secondary | ICD-10-CM | POA: Insufficient documentation

## 2019-09-10 DIAGNOSIS — Z1211 Encounter for screening for malignant neoplasm of colon: Secondary | ICD-10-CM | POA: Insufficient documentation

## 2019-09-10 DIAGNOSIS — K635 Polyp of colon: Secondary | ICD-10-CM

## 2019-09-10 HISTORY — PX: COLONOSCOPY WITH PROPOFOL: SHX5780

## 2019-09-10 LAB — POCT PREGNANCY, URINE: Preg Test, Ur: NEGATIVE

## 2019-09-10 SURGERY — COLONOSCOPY WITH PROPOFOL
Anesthesia: General

## 2019-09-10 MED ORDER — PROPOFOL 10 MG/ML IV BOLUS
INTRAVENOUS | Status: AC
  Filled 2019-09-10: qty 20

## 2019-09-10 MED ORDER — SODIUM CHLORIDE 0.9 % IV SOLN: INTRAVENOUS | Status: DC

## 2019-09-10 MED ORDER — LIDOCAINE HCL (CARDIAC) PF 100 MG/5ML IV SOSY
PREFILLED_SYRINGE | INTRAVENOUS | Status: DC | PRN
  Administered 2019-09-10: 50 mg via INTRAVENOUS

## 2019-09-10 MED ORDER — PROPOFOL 500 MG/50ML IV EMUL
INTRAVENOUS | Status: DC | PRN
  Administered 2019-09-10: 150 ug/kg/min via INTRAVENOUS
  Administered 2019-09-10: 50 mg via INTRAVENOUS
  Administered 2019-09-10: 20 mg via INTRAVENOUS

## 2019-09-10 MED ORDER — PROPOFOL 500 MG/50ML IV EMUL
INTRAVENOUS | Status: AC
  Filled 2019-09-10: qty 50

## 2019-09-10 NOTE — H&P (Addendum)
Jonathon Bellows, MD [address], [place], [place], [place], [address] [address], [place], [place], [place], [address] Phone: [phone]  Fax: [phone]  Primary Care Physician:  Volney American, PA-C   Pre-Procedure History & Physical: HPI:  Tiffany Mata is a 54 y.o. female is here for an colonoscopy.   Past Medical History:  Diagnosis Date  . GERD (gastroesophageal reflux disease)    occ  . Stress     Past Surgical History:  Procedure Laterality Date  . CESAREAN SECTION    . DILATION AND CURETTAGE OF UTERUS    . EYE SURGERY    . FOOT SURGERY    . HALLUX VALGUS CORRECTION    . KNEE ARTHROSCOPY WITH MEDIAL MENISECTOMY Right 06/13/2018   Procedure: KNEE ARTHROSCOPY WITH MEDIAL MENISECTOMY;  Surgeon: Thornton Park, MD;  Location: [facility] ORS;  Service: Orthopedics;  Laterality: Right;  . REFRACTIVE SURGERY    . TOE SURGERY    . TONSILLECTOMY AND ADENOIDECTOMY      Prior to Admission medications   Medication Sig Start Date End Date Taking? Authorizing Provider  Biotin w/ Vitamins C & E (HAIR/SKIN/NAILS PO) Take 1 Dose by mouth.    [provider]  Calcium Carbonate (CALCIUM 600 PO) Take 600 mg by mouth daily.     [provider]  ibuprofen (ADVIL) 800 MG tablet Take 1 tablet (800 mg total) by mouth every 8 (eight) hours as needed. 11/29/18   Volney American, PA-C  loratadine (CLARITIN) 10 MG tablet Take 10 mg by mouth daily.    [provider]  nitrofurantoin, macrocrystal-monohydrate, (MACROBID) 100 MG capsule Take 1 capsule (100 mg total) by mouth 2 (two) times daily. 08/21/19   Malachy Mood, MD  POTASSIUM PO Take 1 tablet by mouth daily.     [provider]    Allergies as of 08/14/2019  . (No Known Allergies)    Family History  Problem Relation Age of Onset  . Stroke Mother   . Hypertension Mother   . Cancer Mother        Lung  . Diabetes Father     Social History   Socioeconomic History  .  Marital status: Married    Spouse name: Not on file  . Number of children: Not on file  . Years of education: Not on file  . Highest education level: Not on file  Occupational History  . Not on file  Tobacco Use  . Smoking status: Former Smoker    Years: 20.00    Types: Cigarettes  . Smokeless tobacco: Never Used  . Tobacco comment: very light smoker  Substance and Sexual Activity  . Alcohol use: No    Alcohol/week: 0.0 standard drinks  . Drug use: No  . Sexual activity: Yes    Birth control/protection: None    Comment: Ablation  Other Topics Concern  . 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 Difficulty of Paying Living Expenses:   Food Insecurity:   . Worried About Charity fundraiser in the Last Year:   . Arboriculturist in the Last Year:   Transportation Needs:   . Film/video editor (Medical):   [place] Lack of Transportation (Non-Medical):   Physical Activity:   .  Days of Exercise per Week:   . Minutes of Exercise per Session:   Stress:   . Feeling of Stress :   Social Connections:   . Frequency of Communication with Friends and Family:   . Frequency of Social Gatherings with Friends and Family:   . Attends Religious Services:   . Active Member of Clubs or Organizations:   . Attends Archivist Meetings:   [place] Marital Status:   Intimate Partner Violence:   . Fear of Current or Ex-Partner:   . Emotionally Abused:   [place] Physically Abused:   . Sexually Abused:     Review of Systems: See HPI, otherwise negative ROS  Physical Exam: BP (!) 147/109   Pulse 84   Temp (!) 97.2 F (36.2 C) (Temporal)   Resp 16   Ht 5\' 9"  (1.753 m)   Wt 72.1 kg   SpO2 99%   BMI 23.48 kg/m  General:   Alert,  pleasant and cooperative in NAD Head:  Normocephalic and atraumatic. Neck:  Supple; no masses or  thyromegaly. Lungs:  Clear throughout to auscultation, normal respiratory effort.    Heart:  +S1, +S2, Regular rate and rhythm, No edema. Abdomen:  Soft, nontender and nondistended. Normal bowel sounds, without guarding, and without rebound.   Neurologic:  Alert and  oriented x4;  grossly normal neurologically.  Impression/Plan: Tiffany Mata is here for an colonoscopy to be performed for personal history of colon polyps. Risks, benefits, limitations, and alternatives regarding  colonoscopy have been reviewed with the patient.  Questions have been answered.  All parties agreeable.   Jonathon Bellows, MD  09/10/2019, 10:19 AM

## 2019-09-10 NOTE — Op Note (Signed)
[facility] Gastroenterology Patient Name: Tiffany Mata Procedure Date: 09/10/2019 10:24 AM MRN: [id number] Account #: [id number] Date of Birth: 1966/03/31 Admit Type: Outpatient Age: 54 Room: [facility] ENDO ROOM 4 Gender: Female Note Status: Finalized Procedure:             Colonoscopy Indications:           High risk colon cancer surveillance: Personal history                         of colonic polyps Providers:             Jonathon Bellows MD, MD Referring MD:          Volney American (Referring MD) Medicines:             Monitored Anesthesia Care Complications:         No immediate complications. Procedure:             Pre-Anesthesia Assessment:                        - Prior to the procedure, a History and Physical was                         performed, and patient medications, allergies and                         sensitivities were reviewed. The patient's tolerance                         of previous anesthesia was reviewed.                        - The risks and benefits of the procedure and the                         sedation options and risks were discussed with the                         patient. All questions were answered and informed                         consent was obtained.                        - ASA Grade Assessment: II - A patient with mild                         systemic disease.                        After obtaining informed consent, the colonoscope was                         passed under direct vision. Throughout the procedure,                         the patient's blood pressure, pulse, and oxygen                         saturations were monitored continuously. The  Colonoscope was introduced through the anus and                         advanced to the the cecum, identified by the                         appendiceal orifice. The colonoscopy was performed                         with ease. The patient tolerated the  procedure well.                         The quality of the bowel preparation was excellent. Findings:      The perianal and digital rectal examinations were normal.      A 3 mm polyp was found in the cecum. The polyp was sessile. The polyp       was removed with a cold biopsy forceps. Resection and retrieval were       complete.      The exam was otherwise without abnormality on direct and retroflexion       views. Impression:            - One 3 mm polyp in the cecum, removed with a cold                         biopsy forceps. Resected and retrieved.                        - The examination was otherwise normal on direct and                         retroflexion views. Recommendation:        - Discharge patient to home (with escort).                        - Resume previous diet.                        - Continue present medications.                        - Await pathology results.                        - Repeat colonoscopy in 5 years for surveillance. Procedure Code(s):     --- Professional ---                        [phone], Colonoscopy, flexible; with biopsy, single or                         multiple Diagnosis Code(s):     --- Professional ---                        Z86.010, Personal history of colonic polyps                        K63.5, Polyp of colon CPT copyright 2019 American Medical Association. All rights reserved. The codes documented in this report are preliminary and upon  coder review may  be revised to meet current compliance requirements. Jonathon Bellows, MD Jonathon Bellows MD, MD 09/10/2019 10:47:02 AM This report has been signed electronically. Number of Addenda: 0 Note Initiated On: 09/10/2019 10:24 AM Scope Withdrawal Time: 0 hours 10 minutes 19 seconds  Total Procedure Duration: 0 hours 13 minutes 39 seconds  Estimated Blood Loss:  Estimated blood loss: none.      [facility]

## 2019-09-10 NOTE — Transfer of Care (Signed)
Immediate Anesthesia Transfer of Care Note  Patient: Tiffany Mata  Procedure(s) Performed: COLONOSCOPY WITH PROPOFOL (N/A )  Patient Location: PACU  Anesthesia Type:General  Level of Consciousness: awake, alert  and oriented  Airway & Oxygen Therapy: Patient Spontanous Breathing and Patient connected to nasal cannula oxygen  Post-op Assessment: Report given to RN and Post -op Vital signs reviewed and stable  Post vital signs: Reviewed and stable  Last Vitals:  Vitals Value Taken Time  BP    Temp    Pulse    Resp    SpO2      Last Pain:  Vitals:   09/10/19 1000  TempSrc: Temporal  PainSc: 0-No pain         Complications: No apparent anesthesia complications

## 2019-09-10 NOTE — Anesthesia Preprocedure Evaluation (Signed)
Anesthesia Evaluation  Patient identified by MRN, date of birth, ID band Patient awake    Reviewed: Allergy & Precautions, NPO status , Patient's Chart, lab work & pertinent test results  History of Anesthesia Complications Negative for: history of anesthetic complications  Airway Mallampati: II  TM Distance: >3 FB Neck ROM: Full    Dental   Pulmonary neg sleep apnea, neg COPD, former smoker,    breath sounds clear to auscultation- rhonchi (-) wheezing      Cardiovascular Exercise Tolerance: Good (-) hypertension(-) CAD, (-) Past MI, (-) Cardiac Stents and (-) CABG  Rhythm:Regular Rate:Normal - Systolic murmurs and - Diastolic murmurs    Neuro/Psych neg Seizures PSYCHIATRIC DISORDERS negative neurological ROS     GI/Hepatic Neg liver ROS, GERD  ,  Endo/Other  negative endocrine ROSneg diabetes  Renal/GU negative Renal ROS     Musculoskeletal negative musculoskeletal ROS (+)   Abdominal (+) - obese,   Peds  Hematology negative hematology ROS (+)   Anesthesia Other Findings Past Medical History: No date: GERD (gastroesophageal reflux disease)     Comment:  occ No date: Stress   Reproductive/Obstetrics                             Anesthesia Physical Anesthesia Plan  ASA: II  Anesthesia Plan: General   Post-op Pain Management:    Induction: Intravenous  PONV Risk Score and Plan: 2 and Propofol infusion  Airway Management Planned: Natural Airway  Additional Equipment:   Intra-op Plan:   Post-operative Plan:   Informed Consent: I have reviewed the patients History and Physical, chart, labs and discussed the procedure including the risks, benefits and alternatives for the proposed anesthesia with the patient or authorized representative who has indicated his/her understanding and acceptance.     Dental advisory given  Plan Discussed with: CRNA and  Anesthesiologist  Anesthesia Plan Comments:         Anesthesia Quick Evaluation

## 2019-09-10 NOTE — Anesthesia Postprocedure Evaluation (Signed)
Anesthesia Post Note  Patient: Tiffany Mata  Procedure(s) Performed: COLONOSCOPY WITH PROPOFOL (N/A )  Patient location during evaluation: Endoscopy Anesthesia Type: General Level of consciousness: awake and alert and oriented Pain management: pain level controlled Vital Signs Assessment: post-procedure vital signs reviewed and stable Respiratory status: spontaneous breathing, nonlabored ventilation and respiratory function stable Cardiovascular status: blood pressure returned to baseline and stable Postop Assessment: no signs of nausea or vomiting Anesthetic complications: no     Last Vitals:  Vitals:   09/10/19 1025 09/10/19 1050  BP: (!) 140/95 (!) 122/97  Pulse:    Resp:    Temp:  (!) 36.1 C  SpO2:      Last Pain:  Vitals:   09/10/19 1103  TempSrc:   PainSc: 0-No pain                 Arnetia Bronk

## 2019-09-11 ENCOUNTER — Encounter: Payer: Self-pay | Admitting: *Deleted

## 2019-09-11 LAB — SURGICAL PATHOLOGY

## 2019-09-13 ENCOUNTER — Encounter: Payer: Self-pay | Admitting: Gastroenterology

## 2019-09-18 NOTE — Progress Notes (Incomplete)
09/19/19 11:04 PM   Tiffany Mata 01-02-1966 [id number]  Referring provider: Volney American, PA-C [address] [place],  [place] [address]  No chief complaint on file.   HPI: Tiffany Mata is a 54 y.o. white F with rUTIs presents today for the evaluation and management of gross hematuria. She was referred to [place] by Volney American, PA-C.   She was seen by OB/GYN on 08/21/19 for her annual exam where she reported of rUTIs with gross hematuria. Her PCP had treated her rUTI recently before visit but infection has reoccurred.       1. ***  *** 2. *** *** 3. *** ***     PMH: Past Medical History:  Diagnosis Date  . GERD (gastroesophageal reflux disease)    occ  . Stress     Surgical History: Past Surgical History:  Procedure Laterality Date  . CESAREAN SECTION    . COLONOSCOPY WITH PROPOFOL N/A 09/10/2019   Procedure: COLONOSCOPY WITH PROPOFOL;  Surgeon: Jonathon Bellows, MD;  Location: [facility] ENDOSCOPY;  Service: Gastroenterology;  Laterality: N/A;  Priority 4  . DILATION AND CURETTAGE OF UTERUS    . EYE SURGERY    . FOOT SURGERY    . HALLUX VALGUS CORRECTION    . KNEE ARTHROSCOPY WITH MEDIAL MENISECTOMY Right 06/13/2018   Procedure: KNEE ARTHROSCOPY WITH MEDIAL MENISECTOMY;  Surgeon: Thornton Park, MD;  Location: [facility] ORS;  Service: Orthopedics;  Laterality: Right;  . REFRACTIVE SURGERY    . TOE SURGERY    . TONSILLECTOMY AND ADENOIDECTOMY      Home Medications:  Allergies as of 09/19/2019   No Known Allergies     Medication List       Accurate as of September 18, 2019 11:04 PM. If you have any questions, ask your nurse or doctor.        CALCIUM 600 PO Take 600 mg by mouth daily.   HAIR/SKIN/NAILS PO Take 1 Dose by mouth.   ibuprofen 800 MG tablet Commonly known as: ADVIL Take 1 tablet (800 mg total) by mouth every 8 (eight) hours as needed.   loratadine 10 MG tablet Commonly known as: CLARITIN Take 10 mg by mouth daily.   nitrofurantoin  (macrocrystal-monohydrate) 100 MG capsule Commonly known as: MACROBID Take 1 capsule (100 mg total) by mouth 2 (two) times daily.   POTASSIUM PO Take 1 tablet by mouth daily.       Allergies: No Known Allergies  Family History: Family History  Problem Relation Age of Onset  . Stroke Mother   . Hypertension Mother   . Cancer Mother        Lung  . Diabetes Father     Social History:  reports that she has quit smoking. Her smoking use included cigarettes. She quit after 20.00 years of use. She has never used smokeless tobacco. She reports that she does not drink alcohol or use drugs.   Physical Exam: There were no vitals taken for this visit.  Constitutional:  Alert and oriented, No acute distress. HEENT: [place] AT, moist mucus membranes.  Trachea midline, no masses. Cardiovascular: No clubbing, cyanosis, or edema. Respiratory: Normal respiratory effort, no increased work of breathing. GI: Abdomen is soft, nontender, nondistended, no abdominal masses GU: No CVA tenderness Lymph: No cervical or inguinal lymphadenopathy. Skin: No rashes, bruises or suspicious lesions. Neurologic: Grossly intact, no focal deficits, moving all 4 extremities. Psychiatric: Normal mood and affect.  Laboratory Data:  Lab Results  Component Value Date  CREATININE 0.89 08/10/2019    Urinalysis  Pertinent Imaging: ***  Results for orders placed during the hospital encounter of 01/08/18  CT Renal Stone Study    Assessment & Plan:    @DIAGMED @  No follow-ups on file.   [facility] [address] [place], [place] [address] [phone]  I, Lucas Mallow, am acting as a scribe for Dr. Hollice Espy,  {Add Scribe Attestation Statement}

## 2019-09-19 ENCOUNTER — Encounter: Payer: Self-pay | Admitting: Urology

## 2019-09-19 ENCOUNTER — Ambulatory Visit: Payer: Managed Care, Other (non HMO) | Admitting: Urology

## 2019-09-26 ENCOUNTER — Encounter: Payer: Self-pay | Admitting: Family Medicine

## 2019-09-26 ENCOUNTER — Other Ambulatory Visit: Payer: Self-pay

## 2019-09-26 ENCOUNTER — Ambulatory Visit (INDEPENDENT_AMBULATORY_CARE_PROVIDER_SITE_OTHER): Payer: Managed Care, Other (non HMO) | Admitting: Family Medicine

## 2019-09-26 VITALS — BP 131/87 | HR 89 | Temp 98.1°F | Wt 164.0 lb

## 2019-09-26 DIAGNOSIS — I10 Essential (primary) hypertension: Secondary | ICD-10-CM | POA: Diagnosis not present

## 2019-09-26 DIAGNOSIS — R079 Chest pain, unspecified: Secondary | ICD-10-CM | POA: Diagnosis not present

## 2019-09-26 MED ORDER — HYDROCHLOROTHIAZIDE 12.5 MG PO TABS: 12.5000 mg | ORAL_TABLET | Freq: Every day | ORAL | 0 refills | Status: DC

## 2019-09-26 NOTE — Assessment & Plan Note (Signed)
Mildly elevated consistently the past month, will start low dose HCTZ and continue close monitoring at home. F/u in 1 month for bmp and recheck

## 2019-09-26 NOTE — Progress Notes (Signed)
BP 131/87   Pulse 89   Temp 98.1 F (36.7 C) (Oral)   Wt 164 lb (74.4 kg)   SpO2 99%   BMI 24.22 kg/m    Subjective:    Patient ID: Tiffany Mata, female    DOB: 06-27-1966, 54 y.o.   MRN: [id number]  HPI: Tiffany Mata is a 54 y.o. female  Chief Complaint  Patient presents with  . Hypertension    pt states that her BP has been high x about a month   Presenting today with concerns of elevated blood pressures the past month or so. Readings at home running 140-150/90-106 range. Occasionally having some right sided chest pressure but also under quite a bit of stress. Concerned because of fhx of HTN and strokes. Has never been on BP medications in the past. Denies dizziness, syncope, visual changes, headaches. Eats healthy and works out daily.   Relevant past medical, surgical, family and social history reviewed and updated as indicated. Interim medical history since our last visit reviewed. Allergies and medications reviewed and updated.  Review of Systems  Per HPI unless specifically indicated above     Objective:    BP 131/87   Pulse 89   Temp 98.1 F (36.7 C) (Oral)   Wt 164 lb (74.4 kg)   SpO2 99%   BMI 24.22 kg/m   Wt Readings from Last 3 Encounters:  09/26/19 164 lb (74.4 kg)  09/10/19 159 lb (72.1 kg)  08/21/19 164 lb (74.4 kg)    Physical Exam Vitals and nursing note reviewed.  Constitutional:      Appearance: Normal appearance. She is not ill-appearing.  HENT:     Head: Atraumatic.  Eyes:     Extraocular Movements: Extraocular movements intact.     Conjunctiva/sclera: Conjunctivae normal.  Cardiovascular:     Rate and Rhythm: Normal rate and regular rhythm.     Heart sounds: Normal heart sounds.  Pulmonary:     Effort: Pulmonary effort is normal.     Breath sounds: Normal breath sounds.  Musculoskeletal:        General: Normal range of motion.     Cervical back: Normal range of motion and neck supple.  Skin:    General: Skin is warm and dry.   Neurological:     Mental Status: She is alert and oriented to person, place, and time.  Psychiatric:        Mood and Affect: Mood normal.        Thought Content: Thought content normal.        Judgment: Judgment normal.     Results for orders placed or performed during the hospital encounter of 09/10/19  Pregnancy, urine POC  Result Value Ref Range   Preg Test, Ur NEGATIVE NEGATIVE  Surgical pathology  Result Value Ref Range   SURGICAL PATHOLOGY      SURGICAL PATHOLOGY CASE: [id number] PATIENT: Tiffany Mata Surgical Pathology Report     Specimen Submitted: A. Colon polyp, cecum; cbx  Clinical History: Screening colonoscopy. Colon polyp.      DIAGNOSIS: A. COLON POLYP, CECUM; COLD BIOPSY: - POLYPOID BENIGN COLONIC MUCOSA. - NEGATIVE FOR DYSPLASIA AND MALIGNANCY.  Comment: Multiple deeper sections are examined.   GROSS DESCRIPTION: A. Labeled: Cecum polyp cold biopsy Received: In formalin Tissue fragment(s): 1 Size: 0.6 x 0.1 x 0.1 cm Description: Pink-red tissue fragment Entirely submitted in 1 cassette.   Final Diagnosis performed by Betsy Pries, MD.   Electronically signed 09/11/2019 12:05:27PM The  electronic signature indicates that the named Attending Pathologist has evaluated the specimen Technical component performed at [facility], [address] [place], [place] [address] Lab: [phone] Dir: Rush Farmer, MD, MMM  Professional component performed at [facility], [facility], [place], [place], [place] [address] Lab: [phone] Dir: Dellia Nims. Rubinas, MD       Assessment & Plan:   Problem List Items Addressed This Visit      Cardiovascular and Mediastinum   Essential hypertension    Mildly elevated consistently the past month, will start low dose HCTZ and continue close monitoring at home. F/u in 1 month for bmp and recheck      Relevant Medications   hydrochlorothiazide (HYDRODIURIL) 12.5 MG tablet    Other  Visit Diagnoses    Chest pain, unspecified type    -  Primary   EKG NSR, normal rate and no ST or T wave changes noted. Suspect her occasional pain due to stress, anxiety   Relevant Orders   EKG 12-Lead (Completed)       Follow up plan: Return in about 4 weeks (around 10/24/2019) for HTN f/u.

## 2019-10-26 ENCOUNTER — Other Ambulatory Visit: Payer: Self-pay

## 2019-10-26 ENCOUNTER — Encounter: Payer: Self-pay | Admitting: Family Medicine

## 2019-10-26 ENCOUNTER — Ambulatory Visit (INDEPENDENT_AMBULATORY_CARE_PROVIDER_SITE_OTHER): Payer: Managed Care, Other (non HMO) | Admitting: Family Medicine

## 2019-10-26 VITALS — BP 133/90 | HR 85 | Temp 98.0°F | Wt 162.0 lb

## 2019-10-26 DIAGNOSIS — I10 Essential (primary) hypertension: Secondary | ICD-10-CM | POA: Diagnosis not present

## 2019-10-26 MED ORDER — HYDROCHLOROTHIAZIDE 25 MG PO TABS: 25.0000 mg | ORAL_TABLET | Freq: Every day | ORAL | 0 refills | Status: DC

## 2019-10-26 NOTE — Assessment & Plan Note (Signed)
BPs still not consistently at goal. Increase to 25 mg HCTZ and continue close home monitoring.

## 2019-10-26 NOTE — Progress Notes (Signed)
BP 133/90   Pulse 85   Temp 98 F (36.7 C) (Oral)   Wt 162 lb (73.5 kg)   SpO2 97%   BMI 23.92 kg/m    Subjective:    Patient ID: Tiffany Mata, female    DOB: 01-12-1966, 54 y.o.   MRN: [id number]  HPI: Tiffany Mata is a 54 y.o. female  Chief Complaint  Patient presents with  . Hypertension   Here today for BP f/u after starting low dose HCTZ. Home BPs running 120s-150s/80s-90s. Tolerating well without side effects. Denies CP, SOB, HAs, dizziness.   Relevant past medical, surgical, family and social history reviewed and updated as indicated. Interim medical history since our last visit reviewed. Allergies and medications reviewed and updated.  Review of Systems  Per HPI unless specifically indicated above     Objective:    BP 133/90   Pulse 85   Temp 98 F (36.7 C) (Oral)   Wt 162 lb (73.5 kg)   SpO2 97%   BMI 23.92 kg/m   Wt Readings from Last 3 Encounters:  10/26/19 162 lb (73.5 kg)  09/26/19 164 lb (74.4 kg)  09/10/19 159 lb (72.1 kg)    Physical Exam Vitals and nursing note reviewed.  Constitutional:      Appearance: Normal appearance. She is not ill-appearing.  HENT:     Head: Atraumatic.  Eyes:     Extraocular Movements: Extraocular movements intact.     Conjunctiva/sclera: Conjunctivae normal.  Cardiovascular:     Rate and Rhythm: Normal rate and regular rhythm.     Heart sounds: Normal heart sounds.  Pulmonary:     Effort: Pulmonary effort is normal.     Breath sounds: Normal breath sounds.  Musculoskeletal:        General: Normal range of motion.     Cervical back: Normal range of motion and neck supple.  Skin:    General: Skin is warm and dry.  Neurological:     Mental Status: She is alert and oriented to person, place, and time.  Psychiatric:        Mood and Affect: Mood normal.        Thought Content: Thought content normal.        Judgment: Judgment normal.     Results for orders placed or performed during the hospital encounter  of 09/10/19  Pregnancy, urine POC  Result Value Ref Range   Preg Test, Ur NEGATIVE NEGATIVE  Surgical pathology  Result Value Ref Range   SURGICAL PATHOLOGY      SURGICAL PATHOLOGY CASE: [id number] PATIENT: Tiffany Mata Surgical Pathology Report     Specimen Submitted: A. Colon polyp, cecum; cbx  Clinical History: Screening colonoscopy. Colon polyp.      DIAGNOSIS: A. COLON POLYP, CECUM; COLD BIOPSY: - POLYPOID BENIGN COLONIC MUCOSA. - NEGATIVE FOR DYSPLASIA AND MALIGNANCY.  Comment: Multiple deeper sections are examined.   GROSS DESCRIPTION: A. Labeled: Cecum polyp cold biopsy Received: In formalin Tissue fragment(s): 1 Size: 0.6 x 0.1 x 0.1 cm Description: Pink-red tissue fragment Entirely submitted in 1 cassette.   Final Diagnosis performed by Betsy Pries, MD.   Electronically signed 09/11/2019 12:05:27PM The electronic signature indicates that the named Attending Pathologist has evaluated the specimen Technical component performed at [facility], [address] [place], [place] [address] Lab: [phone] Dir: Rush Farmer, MD, MMM  Professional component performed at [facility], [facility], [place], [place], [place] [address] Lab: [phone] Dir: Dellia Nims. Rubinas,  MD       Assessment & Plan:   Problem List Items Addressed This Visit      Cardiovascular and Mediastinum   Essential hypertension - Primary    BPs still not consistently at goal. Increase to 25 mg HCTZ and continue close home monitoring.       Relevant Medications   hydrochlorothiazide (HYDRODIURIL) 25 MG tablet       Follow up plan: Return in about 4 weeks (around 11/23/2019) for HTN, bmp.

## 2019-10-29 ENCOUNTER — Telehealth: Payer: Self-pay

## 2019-10-29 ENCOUNTER — Other Ambulatory Visit: Payer: Self-pay

## 2019-10-29 ENCOUNTER — Encounter: Payer: Self-pay | Admitting: Obstetrics and Gynecology

## 2019-10-29 ENCOUNTER — Ambulatory Visit: Payer: Self-pay | Admitting: Family Medicine

## 2019-10-29 ENCOUNTER — Ambulatory Visit (INDEPENDENT_AMBULATORY_CARE_PROVIDER_SITE_OTHER): Payer: Managed Care, Other (non HMO) | Admitting: Obstetrics and Gynecology

## 2019-10-29 VITALS — BP 118/86 | Wt 165.0 lb

## 2019-10-29 DIAGNOSIS — N3001 Acute cystitis with hematuria: Secondary | ICD-10-CM

## 2019-10-29 LAB — POCT URINALYSIS DIPSTICK
Bilirubin, UA: NEGATIVE
Glucose, UA: NEGATIVE
Nitrite, UA: NEGATIVE
Protein, UA: NEGATIVE
Spec Grav, UA: >=1.03 — AB (ref 1.010–1.025)
Urobilinogen, UA: NEGATIVE E.U./dL — AB
pH, UA: 6.5 (ref 5.0–8.0)

## 2019-10-29 MED ORDER — NITROFURANTOIN MONOHYD MACRO 100 MG PO CAPS: 100.0000 mg | ORAL_CAPSULE | Freq: Two times a day (BID) | ORAL | 1 refills | Status: DC

## 2019-10-29 NOTE — Progress Notes (Signed)
Obstetrics & Gynecology Office Visit   Chief Complaint:  Chief Complaint  Patient presents with  . Urinary Tract Infection    Blood in urine, urgency x 2 days    History of Present Illness: Ms. Tiffany Mata is a 54 y.o. [phone] who LMP was No LMP recorded. Patient has had an ablation., presents today for a problem visit.  She complains of frequency, hematuria and urgency . She has had symptoms for 2 days. Symptoms are moderate.  Patient denies back pain and fever. Patient does have a history of recurrent UTI,  does not have a history of pyelonephritis, does not have a history of nephrolithiasis.  She has not had previous treatment for her current symptoms.   Review of Systems: Review of Systems  Constitutional: Negative.   Gastrointestinal: Negative.   Genitourinary: Positive for frequency, hematuria and urgency. Negative for dysuria and flank pain.   Past Medical History:  Past Medical History:  Diagnosis Date  . GERD (gastroesophageal reflux disease)    occ  . Stress     Past Surgical History:  Past Surgical History:  Procedure Laterality Date  . CESAREAN SECTION    . COLONOSCOPY WITH PROPOFOL N/A 09/10/2019   Procedure: COLONOSCOPY WITH PROPOFOL;  Surgeon: Jonathon Bellows, MD;  Location: [facility] ENDOSCOPY;  Service: Gastroenterology;  Laterality: N/A;  Priority 4  . DILATION AND CURETTAGE OF UTERUS    . EYE SURGERY    . FOOT SURGERY    . HALLUX VALGUS CORRECTION    . KNEE ARTHROSCOPY WITH MEDIAL MENISECTOMY Right 06/13/2018   Procedure: KNEE ARTHROSCOPY WITH MEDIAL MENISECTOMY;  Surgeon: Thornton Park, MD;  Location: [facility] ORS;  Service: Orthopedics;  Laterality: Right;  . REFRACTIVE SURGERY    . TOE SURGERY    . TONSILLECTOMY AND ADENOIDECTOMY      Gynecologic History: No LMP recorded. Patient has had an ablation.  Obstetric History: [id number]  Family History:  Family History  Problem Relation Age of Onset  . Stroke Mother   . Hypertension Mother   . Cancer  Mother        Lung  . Diabetes Father     Social History:  Social History   Socioeconomic History  . Marital status: Married    Spouse name: Not on file  . Number of children: Not on file  . Years of education: Not on file  . Highest education level: Not on file  Occupational History  . Not on file  Tobacco Use  . Smoking status: Current Some Day Smoker    Years: 20.00    Types: Cigarettes  . Smokeless tobacco: Never Used  . Tobacco comment: very light smoker  Substance and Sexual Activity  . Alcohol use: No    Alcohol/week: 0.0 standard drinks  . Drug use: No  . Sexual activity: Yes    Birth control/protection: None    Comment: Ablation  Other Topics Concern  . Not on file  Social History Narrative   Married   2 daughters- ages 35 and 76   She works as a Art therapist, husband works for [employer] Strain:   . Difficulty of Paying Living Expenses:   Food Insecurity:   . Worried About Charity fundraiser in the Last Year:   . Arboriculturist in the Last Year:   Transportation Needs:   . Film/video editor (Medical):   Marland Kitchen Lack of Transportation (Non-Medical):  Physical Activity:   . Days of Exercise per Week:   . Minutes of Exercise per Session:   Stress:   . Feeling of Stress :   Social Connections:   . Frequency of Communication with Friends and Family:   . Frequency of Social Gatherings with Friends and Family:   . Attends Religious Services:   . Active Member of Clubs or Organizations:   . Attends Archivist Meetings:   Marland Kitchen Marital Status:   Intimate Partner Violence:   . Fear of Current or Ex-Partner:   . Emotionally Abused:   Marland Kitchen Physically Abused:   . Sexually Abused:     Allergies:  No Known Allergies  Medications: Prior to Admission medications   Medication Sig Start Date End Date Taking? Authorizing Provider  Biotin w/ Vitamins C & E (HAIR/SKIN/NAILS PO) Take 1 Dose by mouth.   Yes  [provider]  Calcium Carbonate (CALCIUM 600 PO) Take 600 mg by mouth daily.    Yes [provider]  hydrochlorothiazide (HYDRODIURIL) 25 MG tablet Take 1 tablet (25 mg total) by mouth daily. 10/26/19  Yes Volney American, PA-C  ibuprofen (ADVIL) 800 MG tablet Take 1 tablet (800 mg total) by mouth every 8 (eight) hours as needed. 11/29/18  Yes Volney American, PA-C  POTASSIUM PO Take 1 tablet by mouth daily.    Yes [provider]  nitrofurantoin, macrocrystal-monohydrate, (MACROBID) 100 MG capsule Take 1 capsule (100 mg total) by mouth 2 (two) times daily. 10/29/19   Malachy Mood, MD    Physical Exam Vitals: Blood pressure 118/86, weight 165 lb (74.8 kg).  General: NAD, well nourished, appears stated age 54: normocephalic, anicteric Pulmonary: No increased work of breathing Neurologic: Grossly intact Psychiatric: mood appropriate, affect full  Results for orders placed or performed in visit on 10/29/19 (from the past 24 hour(s))  POCT urinalysis dipstick     Status: Abnormal   Collection Time: 10/29/19  4:40 PM  Result Value Ref Range   Color, UA Gold    Clarity, UA Clear    Glucose, UA Negative Negative   Bilirubin, UA Negative    Ketones, UA Trace    Spec Grav, UA >=1.030 (A) 1.010 - 1.025   Blood, UA 3+    pH, UA 6.5 5.0 - 8.0   Protein, UA Negative Negative   Urobilinogen, UA negative (A) 0.2 or 1.0 E.U./dL   Nitrite, UA Negative    Leukocytes, UA Large (3+) (A) Negative   Appearance     Odor     Assessment: 54 y.o. [id number] with actue UTI  Plan: Problem List Items Addressed This Visit    None    Visit Diagnoses    Acute cystitis with hematuria    -  Primary   Relevant Orders   POCT urinalysis dipstick (Completed)   Urine Culture     1) UTI with hematuria - currently mild symptoms despite hematuria.  Rx macrobid based on on most recent culture 2/5/20201 showing pansensitive E. Coli.  If this culture is also positive  would establish diagnosis of recurrent UTI.  Definition of recurrent urinary tract infections is 2 culture proven episodes in 6 months, or 3 culture proven episodes in 1 year with individual episodes separated by at least two weeks or documentation of differing urinary tract pathogen.  Relapse of urinary tract infection is symptoms within 2 weeks of adequate treatment, or recurrence of symptoms within 2 weeks and documentation of the same causative urinary tract  pathogen.  Museum/gallery exhibitions officer Society Best-Practice Statement: Recurrent Urinary Tract Infection in Adult Women" Female Pelvic Medicine & Reconstructive Surgery Volume 24, Number 5, September/October 2018  2) A total of 15 minutes were spent in face-to-face contact with the patient during this encounter with over half of that time devoted to counseling and coordination of care.  3) Return in about 10 months (around 08/28/2020) for annual.    Malachy Mood, MD, [place], [place] 10/29/2019, 5:01 PM

## 2019-10-29 NOTE — Telephone Encounter (Signed)
Patient reports she has UTI. She's wiping blood. It started yesterday. She is unable to get in/be seen at PCP. Inquiring if she can be seen here. [phone]

## 2019-10-29 NOTE — Telephone Encounter (Signed)
Spoke w/patient. Offered open apts for the day, [facility] at 1:50 or AMS at 4:30. Pt unable to get away from work until after 2:30 and accepts 4:30 apt w/[facility].

## 2019-10-31 LAB — URINE CULTURE

## 2019-11-29 ENCOUNTER — Ambulatory Visit: Payer: Managed Care, Other (non HMO) | Admitting: Family Medicine

## 2019-12-04 ENCOUNTER — Other Ambulatory Visit: Payer: Self-pay

## 2019-12-04 ENCOUNTER — Ambulatory Visit (INDEPENDENT_AMBULATORY_CARE_PROVIDER_SITE_OTHER): Payer: Managed Care, Other (non HMO) | Admitting: Family Medicine

## 2019-12-04 ENCOUNTER — Encounter: Payer: Self-pay | Admitting: Family Medicine

## 2019-12-04 VITALS — BP 130/89 | HR 98 | Temp 98.0°F | Wt 161.0 lb

## 2019-12-04 DIAGNOSIS — I10 Essential (primary) hypertension: Secondary | ICD-10-CM

## 2019-12-04 MED ORDER — LOSARTAN POTASSIUM-HCTZ 50-12.5 MG PO TABS: 1.0000 | ORAL_TABLET | Freq: Every day | ORAL | 0 refills | Status: DC

## 2019-12-04 NOTE — Assessment & Plan Note (Signed)
BPs still above goal, switch to losartan hctz and continue to monitor. F/u in 1 month

## 2019-12-04 NOTE — Progress Notes (Signed)
BP 130/89   Pulse 98   Temp 98 F (36.7 C) (Oral)   Wt 161 lb (73 kg)   SpO2 99%   BMI 23.78 kg/m    Subjective:    Patient ID: Tiffany Mata, female    DOB: 02/21/1966, 54 y.o.   MRN: [id number]  HPI: Tiffany Mata is a 54 y.o. female  Chief Complaint  Patient presents with  . Hypertension   Here today for BP f/u. BPs still fluctuating between 130s-140s/80s-100 on 25 mg HCTZ. Tolerating well without side effects. Denies CP, SOB, HAs, dizziness. Eating well and staying active. .   Relevant past medical, surgical, family and social history reviewed and updated as indicated. Interim medical history since our last visit reviewed. Allergies and medications reviewed and updated.  Review of Systems  Per HPI unless specifically indicated above     Objective:    BP 130/89   Pulse 98   Temp 98 F (36.7 C) (Oral)   Wt 161 lb (73 kg)   SpO2 99%   BMI 23.78 kg/m   Wt Readings from Last 3 Encounters:  12/04/19 161 lb (73 kg)  10/29/19 165 lb (74.8 kg)  10/26/19 162 lb (73.5 kg)    Physical Exam Vitals and nursing note reviewed.  Constitutional:      Appearance: Normal appearance. She is not ill-appearing.  HENT:     Head: Atraumatic.  Eyes:     Extraocular Movements: Extraocular movements intact.     Conjunctiva/sclera: Conjunctivae normal.  Cardiovascular:     Rate and Rhythm: Normal rate and regular rhythm.     Heart sounds: Normal heart sounds.  Pulmonary:     Effort: Pulmonary effort is normal.     Breath sounds: Normal breath sounds.  Musculoskeletal:        General: Normal range of motion.     Cervical back: Normal range of motion and neck supple.  Skin:    General: Skin is warm and dry.  Neurological:     Mental Status: She is alert and oriented to person, place, and time.  Psychiatric:        Mood and Affect: Mood normal.        Thought Content: Thought content normal.        Judgment: Judgment normal.     Results for orders placed or performed in  visit on 10/29/19  Urine Culture   Specimen: Urine   UR  Result Value Ref Range   Urine Culture, Routine Final report    Organism ID, Bacteria Comment   POCT urinalysis dipstick  Result Value Ref Range   Color, UA Gold    Clarity, UA Clear    Glucose, UA Negative Negative   Bilirubin, UA Negative    Ketones, UA Trace    Spec Grav, UA >=1.030 (A) 1.010 - 1.025   Blood, UA 3+    pH, UA 6.5 5.0 - 8.0   Protein, UA Negative Negative   Urobilinogen, UA negative (A) 0.2 or 1.0 E.U./dL   Nitrite, UA Negative    Leukocytes, UA Large (3+) (A) Negative   Appearance     Odor        Assessment & Plan:   Problem List Items Addressed This Visit      Cardiovascular and Mediastinum   Essential hypertension - Primary    BPs still above goal, switch to losartan hctz and continue to monitor. F/u in 1 month      Relevant Medications  losartan-hydrochlorothiazide (HYZAAR) 50-12.5 MG tablet       Follow up plan: Return in about 4 weeks (around 01/01/2020) for BP.

## 2019-12-06 ENCOUNTER — Ambulatory Visit: Payer: Self-pay

## 2019-12-06 NOTE — Telephone Encounter (Signed)
Possibly dehydration from giving plasma in addition to new dose of BP medication. Can try increasing fluids and cut tab in half and see how that goes

## 2019-12-06 NOTE — Telephone Encounter (Signed)
Patient notified

## 2019-12-06 NOTE — Telephone Encounter (Signed)
Pt. Reports she was seen 12/04/19 and started on new blood pressure medication, losartan hctz. Took it yesterday. Also gave plasma yesterday. Yesterday around 5 p.m., she had a dizzy "spell and almost passed out." No dizziness this morning. BP 114/84 and pulse 78 this morning. Has not taken her BP medication this morning."I'm afraid to." Please advise pt. Answer Assessment - Initial Assessment Questions 1. BLOOD PRESSURE: "What is the blood pressure?" "Did you take at least two measurements 5 minutes apart?"     114/84 2. ONSET: "When did you take your blood pressure?"     Yesterday 3. HOW: "How did you obtain the blood pressure?" (e.g., visiting nurse, automatic home BP monitor)     Home monitor 4. HISTORY: "Do you have a history of low blood pressure?" "What is your blood pressure normally?"     High blood pressure 5. MEDICATIONS: "Are you taking any medications for blood pressure?" If yes: "Have they been changed recently?"     Yes 6. PULSE RATE: "Do you know what your pulse rate is?"      78 7. OTHER SYMPTOMS: "Have you been sick recently?" "Have you had a recent injury?"     No 8. PREGNANCY: "Is there any chance you are pregnant?" "When was your last menstrual period?"     No  Protocols used: LOW BLOOD PRESSURE-A-AH

## 2019-12-25 ENCOUNTER — Other Ambulatory Visit: Payer: Self-pay | Admitting: Family Medicine

## 2019-12-25 NOTE — Telephone Encounter (Signed)
Requested Prescriptions  Pending Prescriptions Disp Refills  . ibuprofen (ADVIL) 800 MG tablet [Pharmacy Med Name: IBUPROFEN 800MG  TABLETS] 60 tablet 2    Sig: TAKE 1 TABLET(800 MG) BY MOUTH EVERY 8 HOURS AS NEEDED     Analgesics:  NSAIDS Passed - 12/25/2019  2:57 PM      Passed - Cr in normal range and within 360 days    Creatinine, Ser  Date Value Ref Range Status  08/10/2019 0.89 0.57 - 1.00 mg/dL Final         Passed - HGB in normal range and within 360 days    Hemoglobin  Date Value Ref Range Status  08/10/2019 15.4 11.1 - 15.9 g/dL Final         Passed - Patient is not pregnant      Passed - Valid encounter within last 12 months    Recent Outpatient Visits          3 weeks ago Essential hypertension   [place], [place], [place]   2 months ago Essential hypertension   [place], [place], [place]   3 months ago Chest pain, unspecified type   [place], [place], [place]   4 months ago Nasal congestion   [facility] [place], [place]   6 months ago Urinary frequency   [place] [place] [facility], [place], [place]      Future Appointments            In 1 week O[facility] Desanctis, [place], [place], [place]

## 2020-01-02 ENCOUNTER — Ambulatory Visit (INDEPENDENT_AMBULATORY_CARE_PROVIDER_SITE_OTHER): Payer: Managed Care, Other (non HMO) | Admitting: Family Medicine

## 2020-01-02 ENCOUNTER — Other Ambulatory Visit: Payer: Self-pay

## 2020-01-02 ENCOUNTER — Encounter: Payer: Self-pay | Admitting: Family Medicine

## 2020-01-02 VITALS — BP 119/83 | HR 72 | Temp 97.9°F | Wt 161.0 lb

## 2020-01-02 DIAGNOSIS — I10 Essential (primary) hypertension: Secondary | ICD-10-CM

## 2020-01-02 MED ORDER — IBUPROFEN 800 MG PO TABS: ORAL_TABLET | ORAL | 2 refills | Status: DC

## 2020-01-02 MED ORDER — LOSARTAN POTASSIUM-HCTZ 50-12.5 MG PO TABS: 1.0000 | ORAL_TABLET | Freq: Every day | ORAL | 1 refills | Status: DC

## 2020-01-02 NOTE — Assessment & Plan Note (Signed)
BPs stable and wnl, continue current regimen. BMP today

## 2020-01-02 NOTE — Progress Notes (Signed)
BP 119/83   Pulse 72   Temp 97.9 F (36.6 C) (Oral)   Wt 161 lb (73 kg)   SpO2 98%   BMI 23.78 kg/m    Subjective:    Patient ID: Tiffany Mata, female    DOB: 1966/03/09, 54 y.o.   MRN: [id number]  HPI: Tiffany Mata is a 54 y.o. female  Chief Complaint  Patient presents with  . Hypertension   Presenting today for 1 month f/u HTN after starting losartan hctz combo. Tolerating well, taking faithfully. Denies CP, SOB, HAs, dizziness. BPs running right around 120/80 consistently. Staying active and trying to eat healthy diet.   Relevant past medical, surgical, family and social history reviewed and updated as indicated. Interim medical history since our last visit reviewed. Allergies and medications reviewed and updated.  Review of Systems  Per HPI unless specifically indicated above     Objective:    BP 119/83   Pulse 72   Temp 97.9 F (36.6 C) (Oral)   Wt 161 lb (73 kg)   SpO2 98%   BMI 23.78 kg/m   Wt Readings from Last 3 Encounters:  01/02/20 161 lb (73 kg)  12/04/19 161 lb (73 kg)  10/29/19 165 lb (74.8 kg)    Physical Exam Vitals and nursing note reviewed.  Constitutional:      Appearance: Normal appearance. She is not ill-appearing.  HENT:     Head: Atraumatic.  Eyes:     Extraocular Movements: Extraocular movements intact.     Conjunctiva/sclera: Conjunctivae normal.  Cardiovascular:     Rate and Rhythm: Normal rate and regular rhythm.     Heart sounds: Normal heart sounds.  Pulmonary:     Effort: Pulmonary effort is normal.     Breath sounds: Normal breath sounds.  Musculoskeletal:        General: Normal range of motion.     Cervical back: Normal range of motion and neck supple.  Skin:    General: Skin is warm and dry.  Neurological:     Mental Status: She is alert and oriented to person, place, and time.  Psychiatric:        Mood and Affect: Mood normal.        Thought Content: Thought content normal.        Judgment: Judgment normal.      Results for orders placed or performed in visit on 10/29/19  Urine Culture   Specimen: Urine   UR  Result Value Ref Range   Urine Culture, Routine Final report    Organism ID, Bacteria Comment   POCT urinalysis dipstick  Result Value Ref Range   Color, UA Gold    Clarity, UA Clear    Glucose, UA Negative Negative   Bilirubin, UA Negative    Ketones, UA Trace    Spec Grav, UA >=1.030 (A) 1.010 - 1.025   Blood, UA 3+    pH, UA 6.5 5.0 - 8.0   Protein, UA Negative Negative   Urobilinogen, UA negative (A) 0.2 or 1.0 E.U./dL   Nitrite, UA Negative    Leukocytes, UA Large (3+) (A) Negative   Appearance     Odor        Assessment & Plan:   Problem List Items Addressed This Visit      Cardiovascular and Mediastinum   Essential hypertension - Primary    BPs stable and wnl, continue current regimen. BMP today      Relevant Medications  losartan-hydrochlorothiazide (HYZAAR) 50-12.5 MG tablet   Other Relevant Orders   Basic metabolic panel       Follow up plan: Return in about 6 months (around 07/04/2020) for 6 month f/u.

## 2020-01-03 ENCOUNTER — Other Ambulatory Visit: Payer: Self-pay | Admitting: Family Medicine

## 2020-01-03 ENCOUNTER — Ambulatory Visit: Payer: Managed Care, Other (non HMO) | Admitting: Family Medicine

## 2020-01-03 DIAGNOSIS — E876 Hypokalemia: Secondary | ICD-10-CM

## 2020-01-03 LAB — BASIC METABOLIC PANEL
BUN/Creatinine Ratio: 19 (ref 9–23)
BUN: 18 mg/dL (ref 6–24)
CO2: 27 mmol/L (ref 20–29)
Calcium: 9 mg/dL (ref 8.7–10.2)
Chloride: 99 mmol/L (ref 96–106)
Creatinine, Ser: 0.95 mg/dL (ref 0.57–1.00)
GFR calc Af Amer: 78 mL/min/{1.73_m2} (ref >59)
GFR calc non Af Amer: 68 mL/min/{1.73_m2} (ref >59)
Glucose: 71 mg/dL (ref 65–99)
Potassium: 3.1 mmol/L — ABNORMAL LOW (ref 3.5–5.2)
Sodium: 137 mmol/L (ref 134–144)

## 2020-02-08 ENCOUNTER — Encounter: Payer: Self-pay | Admitting: Family Medicine

## 2020-04-18 ENCOUNTER — Ambulatory Visit (INDEPENDENT_AMBULATORY_CARE_PROVIDER_SITE_OTHER): Payer: Managed Care, Other (non HMO)

## 2020-04-18 ENCOUNTER — Other Ambulatory Visit: Payer: Self-pay

## 2020-04-18 DIAGNOSIS — Z23 Encounter for immunization: Secondary | ICD-10-CM

## 2020-06-26 ENCOUNTER — Ambulatory Visit
Admission: RE | Admit: 2020-06-26 | Discharge: 2020-06-26 | Disposition: A | Discharge status: Home / Self Care | Payer: Managed Care, Other (non HMO) | Source: Ambulatory Visit | Attending: Family Medicine | Admitting: Family Medicine

## 2020-06-26 ENCOUNTER — Encounter: Payer: Self-pay | Admitting: Family Medicine

## 2020-06-26 ENCOUNTER — Other Ambulatory Visit: Payer: Self-pay

## 2020-06-26 VITALS — BP 124/83 | HR 110 | Temp 98.2°F | Resp 18 | Ht 69.0 in | Wt 173.0 lb

## 2020-06-26 DIAGNOSIS — H66001 Acute suppurative otitis media without spontaneous rupture of ear drum, right ear: Secondary | ICD-10-CM

## 2020-06-26 DIAGNOSIS — H9203 Otalgia, bilateral: Secondary | ICD-10-CM | POA: Diagnosis not present

## 2020-06-26 DIAGNOSIS — R059 Cough, unspecified: Secondary | ICD-10-CM | POA: Diagnosis not present

## 2020-06-26 DIAGNOSIS — R000 Tachycardia, unspecified: Secondary | ICD-10-CM

## 2020-06-26 DIAGNOSIS — R0981 Nasal congestion: Secondary | ICD-10-CM

## 2020-06-26 MED ORDER — FLUCONAZOLE 150 MG PO TABS: ORAL_TABLET | ORAL | 0 refills | Status: DC

## 2020-06-26 MED ORDER — AMOXICILLIN-POT CLAVULANATE 875-125 MG PO TABS: 1.0000 | ORAL_TABLET | Freq: Two times a day (BID) | ORAL | 0 refills | Status: AC

## 2020-06-26 NOTE — ED Provider Notes (Signed)
[facility]   [id number] 06/26/20 Arrival Time: 0946   CC: COVID symptoms  SUBJECTIVE: History from: patient.  Tiffany Mata is a 54 y.o. female who presents with abrupt onset of nasal congestion, PND, bilateral ear pain, cough.  Had negative Covid test at home.  Has been sick for the last 10 days.  Denies sick exposure to COVID, flu or strep. Denies recent travel. Has negative history of Covid. Has not completed Covid vaccines. Has not taken OTC medications for this. There are no aggravating or alleviating factors. Denies previous symptoms in the past. Denies fever, chills, fatigue, sinus pain,  SOB, wheezing, chest pain, nausea, changes in bowel or bladder habits.    ROS: As per HPI.  All other pertinent ROS negative.     Past Medical History:  Diagnosis Date  . GERD (gastroesophageal reflux disease)    occ  . Stress    Past Surgical History:  Procedure Laterality Date  . CESAREAN SECTION    . COLONOSCOPY WITH PROPOFOL N/A 09/10/2019   Procedure: COLONOSCOPY WITH PROPOFOL;  Surgeon: Wyline Mood, MD;  Location: [facility] ENDOSCOPY;  Service: Gastroenterology;  Laterality: N/A;  Priority 4  . DILATION AND CURETTAGE OF UTERUS    . EYE SURGERY    . FOOT SURGERY    . HALLUX VALGUS CORRECTION    . KNEE ARTHROSCOPY WITH MEDIAL MENISECTOMY Right 06/13/2018   Procedure: KNEE ARTHROSCOPY WITH MEDIAL MENISECTOMY;  Surgeon: Juanell Fairly, MD;  Location: [facility] ORS;  Service: Orthopedics;  Laterality: Right;  . REFRACTIVE SURGERY    . TOE SURGERY    . TONSILLECTOMY AND ADENOIDECTOMY     No Known Allergies No current facility-administered medications on file prior to encounter.   Current Outpatient Medications on File Prior to Encounter  Medication Sig Dispense Refill  . Biotin w/ Vitamins C & E (HAIR/SKIN/NAILS PO) Take 1 Dose by mouth.    . Calcium Carbonate (CALCIUM 600 PO) Take 600 mg by mouth daily.     Marland Kitchen ibuprofen (ADVIL) 800 MG tablet TAKE 1 TABLET(800 MG) BY MOUTH EVERY 8  HOURS AS NEEDED 60 tablet 2  . losartan-hydrochlorothiazide (HYZAAR) 50-12.5 MG tablet Take 1 tablet by mouth daily. 90 tablet 1  . POTASSIUM PO Take 1 tablet by mouth daily.      Social History   Socioeconomic History  . Marital status: Married    Spouse name: Not on file  . Number of children: Not on file  . Years of education: Not on file  . Highest education level: Not on file  Occupational History  . Not on file  Tobacco Use  . Smoking status: Current Some Day Smoker    Years: 20.00    Types: Cigarettes  . Smokeless tobacco: Never Used  . Tobacco comment: very light smoker  Vaping Use  . Vaping Use: Never used  Substance and Sexual Activity  . Alcohol use: No    Alcohol/week: 0.0 standard drinks  . Drug use: No  . Sexual activity: Yes    Birth control/protection: None    Comment: Ablation  Other Topics Concern  . Not on file  Social History Narrative   ** Merged History Encounter **       Married 2 daughters- ages 73 and 79 She works as a Clinical research associate, husband works for [employer] of Corporate investment banker Strain: Not on [employer] Insecurity: Not on file  Transportation Needs: Not on file  Physical Activity: Not  on file  Stress: Not on file  Social Connections: Not on file  Intimate Partner Violence: Not on file   Family History  Problem Relation Age of Onset  . Stroke Mother   . Hypertension Mother   . Cancer Mother        Lung  . Diabetes Father     OBJECTIVE:  Vitals:   06/26/20 0959 06/26/20 1001  BP:  124/83  Pulse:  (!) 110  Resp:  18  Temp:  98.2 F (36.8 C)  TempSrc:  Oral  SpO2:  97%  Weight: 173 lb (78.5 kg)   Height: 5\' 9"  (1.753 m)      General appearance: alert; appears fatigued, but nontoxic; speaking in full sentences and tolerating own secretions HEENT: NCAT; Ears: EACs clear, L TM erythematous, R TM erythematous, bulging, effusion ; Eyes: PERRL.  EOM grossly intact. Sinuses: nontender; Nose: nares  patent without rhinorrhea, Throat: oropharynx erythematous, cobblestoning presetn, tonsils non erythematous or enlarged, uvula midline  Neck: supple without LAD Lungs: unlabored respirations, symmetrical air entry; cough: absent; no respiratory distress; CTAB Heart: regular rate and rhythm.  Radial pulses 2+ symmetrical bilaterally Skin: warm and dry Psychological: alert and cooperative; normal mood and affect  LABS:  No results found for this or any previous visit (from the past 24 hour(s)).   ASSESSMENT & PLAN:  1. Non-recurrent acute suppurative otitis media of right ear without spontaneous rupture of tympanic membrane   2. Cough   3. Tachycardia   4. Acute otalgia, bilateral   5. Nasal congestion     Meds ordered this encounter  Medications  . amoxicillin-clavulanate (AUGMENTIN) 875-125 MG tablet    Sig: Take 1 tablet by mouth 2 (two) times daily for 7 days.    Dispense:  14 tablet    Refill:  0    Order Specific Question:   Supervising Provider    Answer:   Merrilee Jansky  . fluconazole (DIFLUCAN) 150 MG tablet    Sig: Take one tablet at the onset of symptoms. If symptoms are still present 3 days later, take the second tablet.    Dispense:  2 tablet    Refill:  0    Order Specific Question:   Supervising Provider    Answer:   [id number] Merrilee Jansky   Prescribed Augmentin twice daily x7 days Prescribed fluconazole in case of yeast Get plenty of rest and push fluids Use OTC zyrtec for nasal congestion, runny nose, and/or sore throat Use OTC flonase for nasal congestion and runny nose Use medications daily for symptom relief Use OTC medications like ibuprofen or tylenol as needed fever or pain Call or go to the ED if you have any new or worsening symptoms such as fever, worsening cough, shortness of breath, chest tightness, chest pain, turning blue, changes in mental status.  Reviewed expectations re: course of current medical issues. Questions  answered. Outlined signs and symptoms indicating need for more acute intervention. Patient verbalized understanding. After Visit Summary given.         [[id number]], NP 06/26/20 1035

## 2020-06-26 NOTE — ED Triage Notes (Signed)
Cough and loosing voice x 1 week. Pain to LT ear. Reports coughing up green. Had neg rapid covid 12/26

## 2020-06-26 NOTE — Discharge Instructions (Signed)
I have sent in Augmentin for you to take twice a day for 7 days.  I have sent in fluconazole in case of yeast. Take one tablet at the onset of symptoms. If symptoms are still present in 3 days, take the second tablet.   Follow up with this office or with primary care if symptoms are persisting.  Follow up in the ER for high fever, trouble swallowing, trouble breathing, other concerning symptoms.  

## 2020-07-03 ENCOUNTER — Other Ambulatory Visit: Payer: Self-pay | Admitting: Family Medicine

## 2020-07-03 NOTE — Telephone Encounter (Signed)
   Notes to clinic:  Patient has appt tomorrow    Requested Prescriptions  Pending Prescriptions Disp Refills   losartan-hydrochlorothiazide (HYZAAR) 50-12.5 MG tablet [Pharmacy Med Name: LOSARTAN/HCTZ 50/12.5MG  TABLETS] 90 tablet 1    Sig: Take 1 tablet by mouth daily.      Cardiovascular: ARB + Diuretic Combos Failed - 07/03/2020  3:20 AM      Failed - K in normal range and within 180 days    Potassium  Date Value Ref Range Status  01/02/2020 3.1 (L) 3.5 - 5.2 mmol/L Final          Failed - Na in normal range and within 180 days    Sodium  Date Value Ref Range Status  01/02/2020 137 134 - 144 mmol/L Final          Failed - Cr in normal range and within 180 days    Creatinine, Ser  Date Value Ref Range Status  01/02/2020 0.95 0.57 - 1.00 mg/dL Final          Failed - Ca in normal range and within 180 days    Calcium  Date Value Ref Range Status  01/02/2020 9.0 8.7 - 10.2 mg/dL Final          Failed - Valid encounter within last 6 months    Recent Outpatient Visits           6 months ago Essential hypertension   [facility] Particia Nearing, [place]   7 months ago Essential hypertension   [facility] Ro[place] Maser [place], [place]   8 months ago Essential hypertension   [facility], [place]   9 months ago Chest pain, unspecified type   [facility] Ro[facility] Maser [place], [place]   10 months ago Nasal congestion   [facility] [place], [place]ley Hews, [place]       Future Appointments             Tomorrow Laural Benes, Megan P, DO [facility]             Passed - Patient is not pregnant      Passed - Last BP in normal range    BP Readings from Last 1 Encounters:  06/26/20 124/83

## 2020-07-03 NOTE — Telephone Encounter (Signed)
Patient has appointment tomorrow

## 2020-07-04 ENCOUNTER — Encounter: Payer: Self-pay | Admitting: Family Medicine

## 2020-07-04 ENCOUNTER — Ambulatory Visit: Payer: Managed Care, Other (non HMO) | Admitting: Family Medicine

## 2020-07-04 ENCOUNTER — Telehealth (INDEPENDENT_AMBULATORY_CARE_PROVIDER_SITE_OTHER): Payer: Managed Care, Other (non HMO) | Admitting: Family Medicine

## 2020-07-04 VITALS — BP 119/80 | Wt 170.0 lb

## 2020-07-04 DIAGNOSIS — I10 Essential (primary) hypertension: Secondary | ICD-10-CM | POA: Diagnosis not present

## 2020-07-04 DIAGNOSIS — J0100 Acute maxillary sinusitis, unspecified: Secondary | ICD-10-CM | POA: Diagnosis not present

## 2020-07-04 DIAGNOSIS — R5383 Other fatigue: Secondary | ICD-10-CM

## 2020-07-04 MED ORDER — LOSARTAN POTASSIUM-HCTZ 50-12.5 MG PO TABS: 1.0000 | ORAL_TABLET | Freq: Every day | ORAL | 1 refills | Status: DC

## 2020-07-04 NOTE — Progress Notes (Signed)
BP 119/80   Wt 170 lb (77.1 kg)   BMI 25.10 kg/m    Subjective:    Patient ID: Tiffany Mata, female    DOB: [birth year], 55 y.o.   MRN: [id number]  HPI: Tiffany Mata is a 55 y.o. female  Chief Complaint  Patient presents with  . Hypertension  . Fatigue    Pt states she always feels tired   Has been on antibiotic. Has been starting to feel better, but has been really tired. Went to an urgent care. Negative COVID. Continues with some pressure and congestion, but feeling much better.   HYPERTENSION Hypertension status: controlled  Satisfied with current treatment? no Duration of hypertension: chronic BP monitoring frequency:  not checking BP medication side effects:  no Medication compliance: excellent compliance Previous BP meds: losartan-HCTZ Aspirin: no Recurrent headaches: no Visual changes: no Palpitations: no Dyspnea: no Chest pain: no Lower extremity edema: no Dizzy/lightheaded: no   Relevant past medical, surgical, family and social history reviewed and updated as indicated. Interim medical history since our last visit reviewed. Allergies and medications reviewed and updated.  Review of Systems  Constitutional: Positive for fatigue. Negative for activity change, appetite change, chills, diaphoresis, fever and unexpected weight change.  HENT: Positive for congestion, ear pain, postnasal drip and rhinorrhea. Negative for dental problem, drooling, ear discharge, facial swelling, hearing loss, mouth sores, nosebleeds, sinus pressure, sinus pain, sneezing, sore throat, tinnitus, trouble swallowing and voice change.   Eyes: Negative.   Respiratory: Negative.   Cardiovascular: Negative.   Gastrointestinal: Negative.   Psychiatric/Behavioral: Negative.     Per HPI unless specifically indicated above     Objective:    BP 119/80   Wt 170 lb (77.1 kg)   BMI 25.10 kg/m   Wt Readings from Last 3 Encounters:  07/04/20 170 lb (77.1 kg)  06/26/20 173 lb (78.5 kg)   01/02/20 161 lb (73 kg)    Physical Exam Vitals and nursing note reviewed.  Pulmonary:     Effort: Pulmonary effort is normal. No respiratory distress.     Comments: Speaking in full sentences Neurological:     Mental Status: She is alert.  Psychiatric:        Mood and Affect: Mood normal.        Behavior: Behavior normal.        Thought Content: Thought content normal.        Judgment: Judgment normal.     Results for orders placed or performed in visit on 93/81/01  Basic metabolic panel  Result Value Ref Range   Glucose 71 65 - 99 mg/dL   BUN 18 6 - 24 mg/dL   Creatinine, Ser 0.95 0.57 - 1.00 mg/dL   GFR calc non Af Amer 68 >59 mL/min/1.73   GFR calc Af Amer 78 >59 mL/min/1.73   BUN/Creatinine Ratio 19 9 - 23   Sodium 137 134 - 144 mmol/L   Potassium 3.1 (L) 3.5 - 5.2 mmol/L   Chloride 99 96 - 106 mmol/L   CO2 27 20 - 29 mmol/L   Calcium 9.0 8.7 - 10.2 mg/dL      Assessment & Plan:   Problem List Items Addressed This Visit      Cardiovascular and Mediastinum   Essential hypertension - Primary    Under good control on current regimen. Continue current regimen. Continue to monitor. Call with any concerns. Refills given. Will come in for labs in the next couple of weeks when she is  feeling better.        Relevant Medications   losartan-hydrochlorothiazide (HYZAAR) 50-12.5 MG tablet   Other Relevant Orders   Basic metabolic panel    Other Visit Diagnoses    Acute non-recurrent maxillary sinusitis       Has seen urgent care. Covid negative. Starting to feel better, but still very fatigued.. Continue current treatment.    Fatigue, unspecified type       Likely due to acute illness. If not feeling better in the next week or so let us know and we'll add CBC and TSH onto her upcoming labs.        Follow up plan: Return in about 6 months (around 01/01/2021) for Physical.   . This visit was completed via telephone due to the restrictions of the COVID-19 pandemic.  All issues as above were discussed and addressed but no physical exam was performed. If it was felt that the patient should be evaluated in the office, they were directed there. The patient verbally consented to this visit. Patient was unable to complete an audio/visual visit due to Lack of equipment. Due to the catastrophic nature of the COVID-19 pandemic, this visit was done through audio contact only. . Location of the patient: home . Location of the provider: work . Those involved with this call:  . Provider: Park Liter, DO . CMA: Louanna Raw, [place] . Front Desk/Registration: Jill Side  . Time spent on call: 25 minutes on the phone discussing health concerns. 40 minutes total spent in review of patient's record and preparation of their chart.

## 2020-07-04 NOTE — Assessment & Plan Note (Signed)
Under good control on current regimen. Continue current regimen. Continue to monitor. Call with any concerns. Refills given. Will come in for labs in the next couple of weeks when she is feeling better.

## 2020-07-29 ENCOUNTER — Telehealth: Payer: Self-pay

## 2020-07-29 NOTE — Telephone Encounter (Signed)
Lvm to make this apt. 

## 2020-07-29 NOTE — Telephone Encounter (Signed)
-----   Message from Valerie Roys, DO sent at 07/04/2020  9:57 AM EST ----- 6 months physical

## 2020-07-31 ENCOUNTER — Other Ambulatory Visit (INDEPENDENT_AMBULATORY_CARE_PROVIDER_SITE_OTHER): Payer: Managed Care, Other (non HMO)

## 2020-07-31 ENCOUNTER — Other Ambulatory Visit: Payer: Self-pay

## 2020-07-31 DIAGNOSIS — I10 Essential (primary) hypertension: Secondary | ICD-10-CM

## 2020-08-01 LAB — BASIC METABOLIC PANEL
BUN/Creatinine Ratio: 25 — ABNORMAL HIGH (ref 9–23)
BUN: 22 mg/dL (ref 6–24)
CO2: 24 mmol/L (ref 20–29)
Calcium: 8.9 mg/dL (ref 8.7–10.2)
Chloride: 105 mmol/L (ref 96–106)
Creatinine, Ser: 0.89 mg/dL (ref 0.57–1.00)
GFR calc Af Amer: 84 mL/min/{1.73_m2} (ref >59)
GFR calc non Af Amer: 73 mL/min/{1.73_m2} (ref >59)
Glucose: 78 mg/dL (ref 65–99)
Potassium: 4.5 mmol/L (ref 3.5–5.2)
Sodium: 143 mmol/L (ref 134–144)

## 2020-09-10 ENCOUNTER — Encounter: Payer: Self-pay | Admitting: Nurse Practitioner

## 2020-09-10 ENCOUNTER — Ambulatory Visit (INDEPENDENT_AMBULATORY_CARE_PROVIDER_SITE_OTHER): Payer: Managed Care, Other (non HMO) | Admitting: Nurse Practitioner

## 2020-09-10 ENCOUNTER — Other Ambulatory Visit: Payer: Self-pay

## 2020-09-10 VITALS — BP 119/81 | HR 84 | Temp 86.0°F | Ht 68.0 in | Wt 166.2 lb

## 2020-09-10 DIAGNOSIS — Z720 Tobacco use: Secondary | ICD-10-CM

## 2020-09-10 DIAGNOSIS — I10 Essential (primary) hypertension: Secondary | ICD-10-CM | POA: Diagnosis not present

## 2020-09-10 DIAGNOSIS — G4700 Insomnia, unspecified: Secondary | ICD-10-CM | POA: Diagnosis not present

## 2020-09-10 DIAGNOSIS — F4323 Adjustment disorder with mixed anxiety and depressed mood: Secondary | ICD-10-CM | POA: Diagnosis not present

## 2020-09-10 DIAGNOSIS — Z0000 Encounter for general adult medical examination without abnormal findings: Secondary | ICD-10-CM | POA: Diagnosis not present

## 2020-09-10 DIAGNOSIS — R8290 Unspecified abnormal findings in urine: Secondary | ICD-10-CM | POA: Diagnosis not present

## 2020-09-10 LAB — URINALYSIS, ROUTINE W REFLEX MICROSCOPIC
Bilirubin, UA: NEGATIVE
Glucose, UA: NEGATIVE
Ketones, UA: NEGATIVE
Nitrite, UA: NEGATIVE
Specific Gravity, UA: >1.03 — ABNORMAL HIGH (ref 1.005–1.030)
Urobilinogen, Ur: 0.2 mg/dL (ref 0.2–1.0)
pH, UA: 6 (ref 5.0–7.5)

## 2020-09-10 LAB — MICROSCOPIC EXAMINATION: Bacteria, UA: NONE SEEN

## 2020-09-10 MED ORDER — SERTRALINE HCL 25 MG PO TABS: 25.0000 mg | ORAL_TABLET | Freq: Every day | ORAL | 0 refills | Status: DC

## 2020-09-10 NOTE — Assessment & Plan Note (Signed)
Chronic.  Ongoing.  Begin Zoloft daily to help control anxiety. Side effects and benefits of medication discussed with patient during visit.  Return in 1 month for reevaluation.

## 2020-09-10 NOTE — Assessment & Plan Note (Signed)
Patient is using melatonin to sleep.  Advised that taking 7-8 melatonin is too many.  Patient declines other sleep aids at this time.  Recommend OTC benadryl or Unisom to help with sleep. Return to clinic if symptoms worsen or fail to improve.

## 2020-09-10 NOTE — Assessment & Plan Note (Signed)
Under good control on current regimen. Continue current regimen. Continue to monitor. Call with any concerns. Labs drawn today.  

## 2020-09-10 NOTE — Addendum Note (Signed)
Addended by: Jon Billings on: 09/10/2020 01:44 PM   Modules accepted: Orders

## 2020-09-10 NOTE — Assessment & Plan Note (Signed)
Current everyday smoker.  Not ready to quit.  Patient declines pneumonia vaccine.

## 2020-09-10 NOTE — Progress Notes (Signed)
Please let patient know that her Urinalysis was abnormal with blood, protein and white blood cells.  This could indicate patient is experiencing a UTI.  I have sent to urine for culture to determine if patient has an infection.  We will let her know when that is back.

## 2020-09-10 NOTE — Progress Notes (Deleted)
   There were no vitals taken for this visit.   Subjective:    Patient ID: Tiffany Mata, female    DOB: 10/17/1965, 55 y.o.   MRN: [id number]  HPI: Tiffany Mata is a 55 y.o. female  No chief complaint on file.  HYPERTENSION Hypertension status: {Blank single:19197::"controlled","uncontrolled","better","worse","exacerbated","stable"}  Satisfied with current treatment? {Blank single:19197::"yes","no"} Duration of hypertension: {Blank single:19197::"chronic","months","years"} BP monitoring frequency:  {Blank single:19197::"not checking","rarely","daily","weekly","monthly","a few times a day","a few times a week","a few times a month"} BP range:  BP medication side effects:  {Blank single:19197::"yes","no"} Medication compliance: {Blank single:19197::"excellent compliance","good compliance","fair compliance","poor compliance"} Previous BP meds:{Blank WERXVQMG:86761::"PJKD","TOIZTIWPYK","DXIPJASNKN/LZJQBHALPF","XTKWIOXB","DZHGDJMEQA","STMHDQQIWL/NLGX","QJJHERDEYC (bystolic)","carvedilol","chlorthalidone","clonidine","diltiazem","exforge HCT","HCTZ","irbesartan (avapro)","labetalol","lisinopril","lisinopril-HCTZ","losartan (cozaar)","methyldopa","nifedipine","olmesartan (benicar)","olmesartan-HCTZ","quinapril","ramipril","spironalactone","tekturna","valsartan","valsartan-HCTZ","verapamil"} Aspirin: {Blank single:19197::"yes","no"} Recurrent headaches: {Blank single:19197::"yes","no"} Visual changes: {Blank single:19197::"yes","no"} Palpitations: {Blank single:19197::"yes","no"} Dyspnea: {Blank single:19197::"yes","no"} Chest pain: {Blank single:19197::"yes","no"} Lower extremity edema: {Blank single:19197::"yes","no"} Dizzy/lightheaded: {Blank single:19197::"yes","no"}   Relevant past medical, surgical, family and social history reviewed and updated as indicated. Interim medical history since our last visit reviewed. Allergies and medications reviewed and updated.  Review of Systems  Per  HPI unless specifically indicated above     Objective:    There were no vitals taken for this visit.  Wt Readings from Last 3 Encounters:  07/04/20 170 lb (77.1 kg)  06/26/20 173 lb (78.5 kg)  01/02/20 161 lb (73 kg)    Physical Exam  Results for orders placed or performed in visit on 14/48/18  Basic metabolic panel  Result Value Ref Range   Glucose 78 65 - 99 mg/dL   BUN 22 6 - 24 mg/dL   Creatinine, Ser 0.89 0.57 - 1.00 mg/dL   GFR calc non Af Amer 73 >59 mL/min/1.73   GFR calc Af Amer 84 >59 mL/min/1.73   BUN/Creatinine Ratio 25 (H) 9 - 23   Sodium 143 134 - 144 mmol/L   Potassium 4.5 3.5 - 5.2 mmol/L   Chloride 105 96 - 106 mmol/L   CO2 24 20 - 29 mmol/L   Calcium 8.9 8.7 - 10.2 mg/dL      Assessment & Plan:   Problem List Items Addressed This Visit      Cardiovascular and Mediastinum   Essential hypertension - Primary       Follow up plan: No follow-ups on file.

## 2020-09-10 NOTE — Progress Notes (Signed)
BP 119/81   Pulse 84   Temp (!) 86 F (30 C)   Ht 5\' 8"  (1.727 m)   Wt 166 lb 3.2 oz (75.4 kg)   SpO2 100%   BMI 25.27 kg/m    Subjective:    Patient ID: Tiffany Mata, female    DOB: 1966-01-27, 55 y.o.   MRN: [id number]  HPI: Tiffany Mata is a 55 y.o. female presenting on 09/10/2020 for comprehensive medical examination. Current medical complaints include:palpitations and anxiety  She currently lives with: Husband and 2 daughters Menopausal Symptoms: yes   ANXIETY Patient states that there are times during the day where she feels a lump in her chest and her heart rate increases to over 100.  She has tried deep breathing to help but it hasn't helped.  Patient denies CP and SOB.  Patient does not feel depressed.  She does not feel like this is due to not sleeping well.    HYPERTENSION Hypertension status: stable  Satisfied with current treatment? yes Duration of hypertension: years BP monitoring frequency:  a few times a week BP range: 120/80 BP medication side effects:  no Medication compliance: excellent compliance Previous BP meds:HCTZ, losartan (cozaar) and verapamil Aspirin: no Recurrent headaches: no Visual changes: no Palpitations: yes Dyspnea: no Chest pain: no Lower extremity edema: no Dizzy/lightheaded: no   GAD 7 : Generalized Anxiety Score 09/10/2020 08/10/2019 08/05/2017  Nervous, Anxious, on Edge 1 0 0  Control/stop worrying 1 0 0  Worry too much - different things 1 0 0  Trouble relaxing 0 0 0  Restless 0 0 0  Easily annoyed or irritable 0 0 0  Afraid - awful might happen 0 0 0  Total GAD 7 Score 3 0 0      Depression Screen done today and results listed below:  Depression screen [facility] 2/9 09/10/2020 08/10/2019 08/05/2017  Decreased Interest 0 0 0  Down, Depressed, Hopeless 0 0 0  PHQ - 2 Score 0 0 0  Altered sleeping - 0 0  Tired, decreased energy - 0 0  Change in appetite - 0 0  Feeling bad or failure about yourself  - 0 0  Trouble concentrating  - 0 0  Moving slowly or fidgety/restless - 0 0  Suicidal thoughts - 0 0  PHQ-9 Score - 0 0    The patient does not have a history of falls. I did not complete a risk assessment for falls. A plan of care for falls was not documented.   Past Medical History:  Past Medical History:  Diagnosis Date  . GERD (gastroesophageal reflux disease)    occ  . Stress     Surgical History:  Past Surgical History:  Procedure Laterality Date  . CESAREAN SECTION    . COLONOSCOPY WITH PROPOFOL N/A 09/10/2019   Procedure: COLONOSCOPY WITH PROPOFOL;  Surgeon: Jonathon Bellows, MD;  Location: [facility] ENDOSCOPY;  Service: Gastroenterology;  Laterality: N/A;  Priority 4  . DILATION AND CURETTAGE OF UTERUS    . EYE SURGERY    . FOOT SURGERY    . HALLUX VALGUS CORRECTION    . KNEE ARTHROSCOPY WITH MEDIAL MENISECTOMY Right 06/13/2018   Procedure: KNEE ARTHROSCOPY WITH MEDIAL MENISECTOMY;  Surgeon: Thornton Park, MD;  Location: [facility] ORS;  Service: Orthopedics;  Laterality: Right;  . REFRACTIVE SURGERY    . TOE SURGERY    . TONSILLECTOMY AND ADENOIDECTOMY      Medications:  Current Outpatient Medications on File Prior to Visit  Medication Sig  . Biotin w/ Vitamins C & E (HAIR/SKIN/NAILS PO) Take 1 Dose by mouth.  . Calcium Carbonate (CALCIUM 600 PO) Take 600 mg by mouth daily.   Marland Kitchen ibuprofen (ADVIL) 800 MG tablet TAKE 1 TABLET(800 MG) BY MOUTH EVERY 8 HOURS AS NEEDED  . losartan-hydrochlorothiazide (HYZAAR) 50-12.5 MG tablet Take 1 tablet by mouth daily.  Marland Kitchen POTASSIUM PO Take 1 tablet by mouth daily.    No current facility-administered medications on file prior to visit.    Allergies:  No Known Allergies  Social History:  Social History   Socioeconomic History  . Marital status: Married    Spouse name: Not on file  . Number of children: Not on file  . Years of education: Not on file  . Highest education level: Not on file  Occupational History  . Not on file  Tobacco Use  . Smoking status:  Current Some Day Smoker    Years: 20.00    Types: Cigarettes  . Smokeless tobacco: Never Used  . Tobacco comment: very light smoker  Vaping Use  . Vaping Use: Never used  Substance and Sexual Activity  . Alcohol use: No    Alcohol/week: 0.0 standard drinks  . Drug use: No  . Sexual activity: Yes    Birth control/protection: None    Comment: Ablation  Other Topics Concern  . Not on file  Social History Narrative   ** Merged History Encounter **       Married 2 daughters- ages 25 and 30 She works as a Art therapist, husband works for [employer] Strain: Not on [employer] Insecurity: Not on Pensions consultant Needs: Not on file  Physical Activity: Not on file  Stress: Not on file  Social Connections: Not on file  Intimate Partner Violence: Not on file   Social History   Tobacco Use  Smoking Status Current Some Day Smoker  . Years: 20.00  . Types: Cigarettes  Smokeless Tobacco Never Used  Tobacco Comment   very light smoker   Social History   Substance and Sexual Activity  Alcohol Use No  . Alcohol/week: 0.0 standard drinks    Family History:  Family History  Problem Relation Age of Onset  . Stroke Mother   . Hypertension Mother   . Cancer Mother        Lung  . Diabetes Father     Past medical history, surgical history, medications, allergies, family history and social history reviewed with patient today and changes made to appropriate areas of the chart.   Review of Systems  Eyes: Negative for blurred vision and double vision.  Respiratory: Negative for shortness of breath.   Cardiovascular: Positive for palpitations. Negative for chest pain and leg swelling.  Neurological: Negative for dizziness and headaches.  Psychiatric/Behavioral: The patient is nervous/anxious and has insomnia.    All other ROS negative except what is listed above and in the HPI.      Objective:    BP 119/81   Pulse 84    Temp (!) 86 F (30 C)   Ht 5\' 8"  (1.727 m)   Wt 166 lb 3.2 oz (75.4 kg)   SpO2 100%   BMI 25.27 kg/m   Wt Readings from Last 3 Encounters:  09/10/20 166 lb 3.2 oz (75.4 kg)  07/04/20 170 lb (77.1 kg)  06/26/20 173 lb (78.5 kg)    Physical Exam Vitals and nursing note reviewed.  Constitutional:      General: She is awake. She is not in acute distress.    Appearance: She is well-developed. She is not ill-appearing.  HENT:     Head: Normocephalic and atraumatic.     Right Ear: Hearing, tympanic membrane, ear canal and external ear normal. No drainage.     Left Ear: Hearing, tympanic membrane, ear canal and external ear normal. No drainage.     Nose: Nose normal.     Right Sinus: No maxillary sinus tenderness or frontal sinus tenderness.     Left Sinus: No maxillary sinus tenderness or frontal sinus tenderness.     Mouth/Throat:     Mouth: Mucous membranes are moist.     Pharynx: Oropharynx is clear. Uvula midline. No pharyngeal swelling, oropharyngeal exudate or posterior oropharyngeal erythema.  Eyes:     General: Lids are normal.        Right eye: No discharge.        Left eye: No discharge.     Extraocular Movements: Extraocular movements intact.     Conjunctiva/sclera: Conjunctivae normal.     Pupils: Pupils are equal, round, and reactive to light.     Visual Fields: Right eye visual fields normal and left eye visual fields normal.  Neck:     Thyroid: No thyromegaly.     Vascular: No carotid bruit.     Trachea: Trachea normal.  Cardiovascular:     Rate and Rhythm: Normal rate and regular rhythm.     Heart sounds: Normal heart sounds. No murmur heard. No gallop.   Pulmonary:     Effort: Pulmonary effort is normal. No accessory muscle usage or respiratory distress.     Breath sounds: Normal breath sounds.  Chest:  Breasts:     Right: Normal. No axillary adenopathy or supraclavicular adenopathy.     Left: Normal. No axillary adenopathy or supraclavicular adenopathy.     Abdominal:     General: Bowel sounds are normal.     Palpations: Abdomen is soft. There is no hepatomegaly or splenomegaly.     Tenderness: There is no abdominal tenderness.  Musculoskeletal:        General: Normal range of motion.     Cervical back: Normal range of motion and neck supple.     Right lower leg: No edema.     Left lower leg: No edema.  Lymphadenopathy:     Head:     Right side of head: No submental, submandibular, tonsillar, preauricular or posterior auricular adenopathy.     Left side of head: No submental, submandibular, tonsillar, preauricular or posterior auricular adenopathy.     Cervical: No cervical adenopathy.     Upper Body:     Right upper body: No supraclavicular, axillary or pectoral adenopathy.     Left upper body: No supraclavicular, axillary or pectoral adenopathy.  Skin:    General: Skin is warm and dry.     Capillary Refill: Capillary refill takes less than 2 seconds.     Findings: No rash.  Neurological:     Mental Status: She is alert and oriented to person, place, and time.     Cranial Nerves: Cranial nerves are intact.     Gait: Gait is intact.     Deep Tendon Reflexes: Reflexes are normal and symmetric.     Reflex Scores:      Brachioradialis reflexes are 2+ on the right side and 2+ on the left side.      Patellar reflexes  are 2+ on the right side and 2+ on the left side. Psychiatric:        Attention and Perception: Attention normal.        Mood and Affect: Mood normal.        Speech: Speech normal.        Behavior: Behavior normal. Behavior is cooperative.        Thought Content: Thought content normal.        Judgment: Judgment normal.     Results for orders placed or performed in visit on 78/93/81  Basic metabolic panel  Result Value Ref Range   Glucose 78 65 - 99 mg/dL   BUN 22 6 - 24 mg/dL   Creatinine, Ser 0.89 0.57 - 1.00 mg/dL   GFR calc non Af Amer 73 >59 mL/min/1.73   GFR calc Af Amer 84 >59 mL/min/1.73    BUN/Creatinine Ratio 25 (H) 9 - 23   Sodium 143 134 - 144 mmol/L   Potassium 4.5 3.5 - 5.2 mmol/L   Chloride 105 96 - 106 mmol/L   CO2 24 20 - 29 mmol/L   Calcium 8.9 8.7 - 10.2 mg/dL      Assessment & Plan:   Problem List Items Addressed This Visit      Cardiovascular and Mediastinum   Essential hypertension    Under good control on current regimen. Continue current regimen. Continue to monitor. Call with any concerns. Labs drawn today.          Other   Insomnia    Patient is using melatonin to sleep.  Advised that taking 7-8 melatonin is too many.  Patient declines other sleep aids at this time.  Recommend OTC benadryl or Unisom to help with sleep. Return to clinic if symptoms worsen or fail to improve.       Adjustment disorder with mixed anxiety and depressed mood    Chronic.  Ongoing.  Begin Zoloft daily to help control anxiety. Side effects and benefits of medication discussed with patient during visit.  Return in 1 month for reevaluation.      Tobacco abuse    Current everyday smoker.  Not ready to quit.  Patient declines pneumonia vaccine.        Other Visit Diagnoses    Annual physical exam    -  Primary   Reveiwed health maintenance.  Declines pneumonia vaccine.  Annual physical labs drawn today. Mammogram ordered.    Relevant Orders   MM Digital Screening   CBC with Differential/Platelet   Comprehensive metabolic panel   Lipid panel   TSH   Urinalysis, Routine w reflex microscopic       Follow up plan: Return in about 1 month (around 10/11/2020) for Anxiety.   LABORATORY TESTING:  - Pap smear: up to date  IMMUNIZATIONS:   - Tdap: Tetanus vaccination status reviewed: last tetanus booster within 10 years. - Influenza: Up to date - Pneumovax: Refused - Prevnar: Not applicable - HPV: Not applicable  SCREENING: -Mammogram: Ordered today  - Colonoscopy: Up to date  - Bone Density: Not applicable  -Hearing Test: Not applicable  -Spirometry: Not  applicable   PATIENT COUNSELING:   Advised to take 1 mg of folate supplement per day if capable of pregnancy.   Sexuality: Discussed sexually transmitted diseases, partner selection, use of condoms, avoidance of unintended pregnancy  and contraceptive alternatives.   Advised to avoid cigarette smoking.  I discussed with the patient that most people either abstain from alcohol or  drink within safe limits (<=14/week and <=4 drinks/occasion for males, <=7/weeks and <= 3 drinks/occasion for females) and that the risk for alcohol disorders and other health effects rises proportionally with the number of drinks per week and how often a drinker exceeds daily limits.  Discussed cessation/primary prevention of drug use and availability of treatment for abuse.   Diet: Encouraged to adjust caloric intake to maintain  or achieve ideal body weight, to reduce intake of dietary saturated fat and total fat, to limit sodium intake by avoiding high sodium foods and not adding table salt, and to maintain adequate dietary potassium and calcium preferably from fresh fruits, vegetables, and low-fat dairy products.    stressed the importance of regular exercise  Injury prevention: Discussed safety belts, safety helmets, smoke detector, smoking near bedding or upholstery.   Dental health: Discussed importance of regular tooth brushing, flossing, and dental visits.    NEXT PREVENTATIVE PHYSICAL DUE IN 1 YEAR. Return in about 1 month (around 10/11/2020) for Anxiety.

## 2020-09-11 ENCOUNTER — Other Ambulatory Visit: Payer: Self-pay | Admitting: Nurse Practitioner

## 2020-09-11 LAB — COMPREHENSIVE METABOLIC PANEL
ALT: 30 IU/L (ref 0–32)
AST: 28 IU/L (ref 0–40)
Albumin/Globulin Ratio: 2.2 (ref 1.2–2.2)
Albumin: 4.4 g/dL (ref 3.8–4.9)
Alkaline Phosphatase: 63 IU/L (ref 44–121)
BUN/Creatinine Ratio: 24 — ABNORMAL HIGH (ref 9–23)
BUN: 21 mg/dL (ref 6–24)
Bilirubin Total: 0.4 mg/dL (ref 0.0–1.2)
CO2: 24 mmol/L (ref 20–29)
Calcium: 9.6 mg/dL (ref 8.7–10.2)
Chloride: 102 mmol/L (ref 96–106)
Creatinine, Ser: 0.87 mg/dL (ref 0.57–1.00)
Globulin, Total: 2 g/dL (ref 1.5–4.5)
Glucose: 98 mg/dL (ref 65–99)
Potassium: 4.1 mmol/L (ref 3.5–5.2)
Sodium: 139 mmol/L (ref 134–144)
Total Protein: 6.4 g/dL (ref 6.0–8.5)
eGFR: 78 mL/min/{1.73_m2} (ref >59)

## 2020-09-11 LAB — CBC WITH DIFFERENTIAL/PLATELET
Basophils Absolute: 0 10*3/uL (ref 0.0–0.2)
Basos: 1 %
EOS (ABSOLUTE): 0.1 10*3/uL (ref 0.0–0.4)
Eos: 1 %
Hematocrit: 46.2 % (ref 34.0–46.6)
Hemoglobin: 16.1 g/dL — ABNORMAL HIGH (ref 11.1–15.9)
Immature Grans (Abs): 0 10*3/uL (ref 0.0–0.1)
Immature Granulocytes: 0 %
Lymphocytes Absolute: 1.7 10*3/uL (ref 0.7–3.1)
Lymphs: 28 %
MCH: 31.6 pg (ref 26.6–33.0)
MCHC: 34.8 g/dL (ref 31.5–35.7)
MCV: 91 fL (ref 79–97)
Monocytes Absolute: 0.6 10*3/uL (ref 0.1–0.9)
Monocytes: 10 %
Neutrophils Absolute: 3.7 10*3/uL (ref 1.4–7.0)
Neutrophils: 60 %
Platelets: 273 10*3/uL (ref 150–450)
RBC: 5.09 x10E6/uL (ref 3.77–5.28)
RDW: 11.8 % (ref 11.7–15.4)
WBC: 6.2 10*3/uL (ref 3.4–10.8)

## 2020-09-11 LAB — LIPID PANEL
Chol/HDL Ratio: 4.7 ratio — ABNORMAL HIGH (ref 0.0–4.4)
Cholesterol, Total: 206 mg/dL — ABNORMAL HIGH (ref 100–199)
HDL: 44 mg/dL (ref >39)
LDL Chol Calc (NIH): 148 mg/dL — ABNORMAL HIGH (ref 0–99)
Triglycerides: 80 mg/dL (ref 0–149)
VLDL Cholesterol Cal: 14 mg/dL (ref 5–40)

## 2020-09-11 LAB — TSH: TSH: 1.44 u[IU]/mL (ref 0.450–4.500)

## 2020-09-11 NOTE — Progress Notes (Signed)
Hi Tiffany Mata.  It was a pleasure meeting you yesterday.  Your blood cell counts, liver, kidneys and electrolytes look good.  Your cholesterol is elevated from prior.  Were you fasting at the appointment? If not, when you return for your follow up come in fasting so I can redraw the cholesterol.  If it is still elevated we can discus the possibility of starting cholesterol medication to treat the cholesterol as well as your elevated cardiac risk score.  Your urine did show that there was some mucus.  I have sent it for culture to make sure you do not have an infection.  Your thyroid labs are also within normal range.  I look forward to our follow up.

## 2020-09-13 LAB — URINE CULTURE

## 2020-09-14 NOTE — Progress Notes (Signed)
No growth on patient's urine culture.  This means she does not have an infection.  Patient should hydrate well and we will recheck her urine at her follow up.

## 2020-10-07 NOTE — Progress Notes (Signed)
BP 102/80   Pulse 77   Temp (!) 97 F (36.1 C)   Wt 167 lb 7 oz (75.9 kg)   SpO2 98%   BMI 25.46 kg/m    Subjective:    Patient ID: Tiffany Mata, female    DOB: 12-Jul-1965, 55 y.o.   MRN: [id number]  HPI: Tiffany Mata is a 55 y.o. female  Chief Complaint  Patient presents with  . Anxiety   ANXIETY Patient was seen one month ago for anxiety complaints.  Patient was started on Zoloft 50m.  Patient states she hasn't noticed a difference in the medication.  Patient did not have side effects from starting the Zoloft. Patient agrees to increase medication.   GAD 7 : Generalized Anxiety Score 10/08/2020 09/10/2020 08/10/2019 08/05/2017  Nervous, Anxious, on Edge 0 1 0 0  Control/stop worrying 0 1 0 0  Worry too much - different things 0 1 0 0  Trouble relaxing 0 0 0 0  Restless 0 0 0 0  Easily annoyed or irritable 0 0 0 0  Afraid - awful might happen 0 0 0 0  Total GAD 7 Score 0 3 0 0  Anxiety Difficulty Not difficult at all - - -    Denies HA, CP, SOB, dizziness, palpitations, visual changes, and lower extremity swelling.  09/10/2020 Patient states that there are times during the day where she feels a lump in her chest and her heart rate increases to over 100.  She has tried deep breathing to help but it hasn't helped.  Patient denies CP and SOB.  Patient does not feel depressed.  She does not feel like this is due to not sleeping  Relevant past medical, surgical, family and social history reviewed and updated as indicated. Interim medical history since our last visit reviewed. Allergies and medications reviewed and updated.  Review of Systems  Eyes: Negative for visual disturbance.  Respiratory: Negative for cough, chest tightness and shortness of breath.   Cardiovascular: Negative for chest pain, palpitations and leg swelling.  Neurological: Negative for dizziness and headaches.  Psychiatric/Behavioral: Negative for dysphoric mood and suicidal ideas. The patient is  nervous/anxious.     Per HPI unless specifically indicated above     Objective:    BP 102/80   Pulse 77   Temp (!) 97 F (36.1 C)   Wt 167 lb 7 oz (75.9 kg)   SpO2 98%   BMI 25.46 kg/m   Wt Readings from Last 3 Encounters:  10/08/20 167 lb 7 oz (75.9 kg)  09/10/20 166 lb 3.2 oz (75.4 kg)  07/04/20 170 lb (77.1 kg)    Physical Exam Vitals and nursing note reviewed.  Constitutional:      General: She is not in acute distress.    Appearance: Normal appearance. She is normal weight. She is not ill-appearing, toxic-appearing or diaphoretic.  HENT:     Head: Normocephalic.     Right Ear: External ear normal.     Left Ear: External ear normal.     Nose: Nose normal.     Mouth/Throat:     Mouth: Mucous membranes are moist.     Pharynx: Oropharynx is clear.  Eyes:     General:        Right eye: No discharge.        Left eye: No discharge.     Extraocular Movements: Extraocular movements intact.     Conjunctiva/sclera: Conjunctivae normal.     Pupils: Pupils are  equal, round, and reactive to light.  Cardiovascular:     Rate and Rhythm: Normal rate and regular rhythm.     Heart sounds: No murmur heard.   Pulmonary:     Effort: Pulmonary effort is normal. No respiratory distress.     Breath sounds: Normal breath sounds. No wheezing or rales.  Musculoskeletal:     Cervical back: Normal range of motion and neck supple.  Skin:    General: Skin is warm and dry.     Capillary Refill: Capillary refill takes less than 2 seconds.  Neurological:     General: No focal deficit present.     Mental Status: She is alert and oriented to person, place, and time. Mental status is at baseline.  Psychiatric:        Mood and Affect: Mood normal.        Behavior: Behavior normal.        Thought Content: Thought content normal.        Judgment: Judgment normal.     Results for orders placed or performed in visit on 09/10/20  Microscopic Examination   Urine  Result Value Ref Range    WBC, UA 0-5 0 - 5 /hpf   RBC 3-10 (A) 0 - 2 /hpf   Epithelial Cells (non renal) 0-10 0 - 10 /hpf   Mucus, UA Present (A) Not Estab.   Bacteria, UA None seen None seen/Few  Urine Culture   Specimen: Urine   UR  Result Value Ref Range   Urine Culture, Routine Final report    Organism ID, Bacteria Comment   CBC with Differential/Platelet  Result Value Ref Range   WBC 6.2 3.4 - 10.8 x10E3/uL   RBC 5.09 3.77 - 5.28 x10E6/uL   Hemoglobin 16.1 (H) 11.1 - 15.9 g/dL   Hematocrit 46.2 34.0 - 46.6 %   MCV 91 79 - 97 fL   MCH 31.6 26.6 - 33.0 pg   MCHC 34.8 31.5 - 35.7 g/dL   RDW 11.8 11.7 - 15.4 %   Platelets 273 150 - 450 x10E3/uL   Neutrophils 60 Not Estab. %   Lymphs 28 Not Estab. %   Monocytes 10 Not Estab. %   Eos 1 Not Estab. %   Basos 1 Not Estab. %   Neutrophils Absolute 3.7 1.4 - 7.0 x10E3/uL   Lymphocytes Absolute 1.7 0.7 - 3.1 x10E3/uL   Monocytes Absolute 0.6 0.1 - 0.9 x10E3/uL   EOS (ABSOLUTE) 0.1 0.0 - 0.4 x10E3/uL   Basophils Absolute 0.0 0.0 - 0.2 x10E3/uL   Immature Granulocytes 0 Not Estab. %   Immature Grans (Abs) 0.0 0.0 - 0.1 x10E3/uL  Comprehensive metabolic panel  Result Value Ref Range   Glucose 98 65 - 99 mg/dL   BUN 21 6 - 24 mg/dL   Creatinine, Ser 0.87 0.57 - 1.00 mg/dL   eGFR 78 >59 mL/min/1.73   BUN/Creatinine Ratio 24 (H) 9 - 23   Sodium 139 134 - 144 mmol/L   Potassium 4.1 3.5 - 5.2 mmol/L   Chloride 102 96 - 106 mmol/L   CO2 24 20 - 29 mmol/L   Calcium 9.6 8.7 - 10.2 mg/dL   Total Protein 6.4 6.0 - 8.5 g/dL   Albumin 4.4 3.8 - 4.9 g/dL   Globulin, Total 2.0 1.5 - 4.5 g/dL   Albumin/Globulin Ratio 2.2 1.2 - 2.2   Bilirubin Total 0.4 0.0 - 1.2 mg/dL   Alkaline Phosphatase 63 44 - 121 IU/L   AST 28  0 - 40 IU/L   ALT 30 0 - 32 IU/L  Lipid panel  Result Value Ref Range   Cholesterol, Total 206 (H) 100 - 199 mg/dL   Triglycerides 80 0 - 149 mg/dL   HDL 44 >39 mg/dL   VLDL Cholesterol Cal 14 5 - 40 mg/dL   LDL Chol Calc (NIH) 148 (H) 0 - 99  mg/dL   Chol/HDL Ratio 4.7 (H) 0.0 - 4.4 ratio  TSH  Result Value Ref Range   TSH 1.440 0.450 - 4.500 uIU/mL  Urinalysis, Routine w reflex microscopic  Result Value Ref Range   Specific Gravity, UA >1.030 (H) 1.005 - 1.030   pH, UA 6.0 5.0 - 7.5   Color, UA Yellow Yellow   Appearance Ur Cloudy (A) Clear   Leukocytes,UA Trace (A) Negative   Protein,UA 1+ (A) Negative/Trace   Glucose, UA Negative Negative   Ketones, UA Negative Negative   RBC, UA 3+ (A) Negative   Bilirubin, UA Negative Negative   Urobilinogen, Ur 0.2 0.2 - 1.0 mg/dL   Nitrite, UA Negative Negative   Microscopic Examination See below:       Assessment & Plan:   Problem List Items Addressed This Visit      Other   Adjustment disorder with mixed anxiety and depressed mood - Primary       Follow up plan: Return in about 2 months (around 12/08/2020) for Anxiety FU.  A total of 30 minutes were spent on this encounter today.  When total time is documented, this includes both the face-to-face and non-face-to-face time personally spent before, during and after the visit on the date of the encounter.

## 2020-10-08 ENCOUNTER — Encounter: Payer: Self-pay | Admitting: Nurse Practitioner

## 2020-10-08 ENCOUNTER — Ambulatory Visit: Payer: Managed Care, Other (non HMO) | Admitting: Nurse Practitioner

## 2020-10-08 VITALS — BP 102/80 | HR 77 | Temp 97.0°F | Wt 167.4 lb

## 2020-10-08 DIAGNOSIS — F4323 Adjustment disorder with mixed anxiety and depressed mood: Secondary | ICD-10-CM

## 2020-10-08 MED ORDER — SERTRALINE HCL 50 MG PO TABS: 50.0000 mg | ORAL_TABLET | Freq: Every day | ORAL | 0 refills | Status: DC

## 2020-10-08 MED ORDER — IBUPROFEN 800 MG PO TABS: ORAL_TABLET | ORAL | 2 refills | Status: DC

## 2020-10-08 NOTE — Assessment & Plan Note (Signed)
Chronic.  Ongoing.  Increased Zoloft to 50mg .  Follow up in 2 months for reevaluation. Return sooner if symptoms worsen.

## 2020-10-14 ENCOUNTER — Telehealth: Payer: Self-pay

## 2020-10-14 ENCOUNTER — Encounter: Payer: Self-pay | Admitting: Nurse Practitioner

## 2020-10-14 NOTE — Telephone Encounter (Signed)
Copied from [place] [id number]. Topic: General - Inquiry >> Oct 14, 2020  2:39 PM Greggory Keen D wrote: Reason for CRM: Pt called asking if she could get a letter saying she had a CPE March 16th this year for her employer.  CB#  [phone]  Letter is ready to be picked up

## 2020-10-21 ENCOUNTER — Telehealth: Payer: Self-pay

## 2020-10-21 NOTE — Telephone Encounter (Signed)
Copied from [place] [id number]. Topic: General - Other >> Oct 21, 2020 11:30 AM Yvette Rack wrote: Reason for CRM: Pt stated she had a physical done but it was not filed. Pt stated she will be fined $50 and she would like the physical to be filed so she could receive credit.   Lvm to ask pt more information , we did file as physical Letter has been ready to be picked up.

## 2020-10-22 NOTE — Progress Notes (Deleted)
There were no vitals taken for this visit.   Subjective:    Patient ID: Tiffany Mata, female    DOB: 14-Sep-1965, 55 y.o.   MRN: [id number]  HPI: Tiffany Mata is a 55 y.o. female  No chief complaint on file.  ANXIETY Patient was seen one month ago for anxiety complaints.  Patient was started on Zoloft 73m.  Patient states she hasn't noticed a difference in the medication.  Patient did not have side effects from starting the Zoloft. Patient agrees to increase medication.   GAD 7 : Generalized Anxiety Score 10/08/2020 09/10/2020 08/10/2019 08/05/2017  Nervous, Anxious, on Edge 0 1 0 0  Control/stop worrying 0 1 0 0  Worry too much - different things 0 1 0 0  Trouble relaxing 0 0 0 0  Restless 0 0 0 0  Easily annoyed or irritable 0 0 0 0  Afraid - awful might happen 0 0 0 0  Total GAD 7 Score 0 3 0 0  Anxiety Difficulty Not difficult at all - - -    Denies HA, CP, SOB, dizziness, palpitations, visual changes, and lower extremity swelling.   Relevant past medical, surgical, family and social history reviewed and updated as indicated. Interim medical history since our last visit reviewed. Allergies and medications reviewed and updated.  Review of Systems  Eyes: Negative for visual disturbance.  Respiratory: Negative for cough, chest tightness and shortness of breath.   Cardiovascular: Negative for chest pain, palpitations and leg swelling.  Neurological: Negative for dizziness and headaches.  Psychiatric/Behavioral: Negative for dysphoric mood and suicidal ideas. The patient is nervous/anxious.     Per HPI unless specifically indicated above     Objective:    There were no vitals taken for this visit.  Wt Readings from Last 3 Encounters:  10/08/20 167 lb 7 oz (75.9 kg)  09/10/20 166 lb 3.2 oz (75.4 kg)  07/04/20 170 lb (77.1 kg)    Physical Exam Vitals and nursing note reviewed.  Constitutional:      General: She is not in acute distress.    Appearance: Normal  appearance. She is normal weight. She is not ill-appearing, toxic-appearing or diaphoretic.  HENT:     Head: Normocephalic.     Right Ear: External ear normal.     Left Ear: External ear normal.     Nose: Nose normal.     Mouth/Throat:     Mouth: Mucous membranes are moist.     Pharynx: Oropharynx is clear.  Eyes:     General:        Right eye: No discharge.        Left eye: No discharge.     Extraocular Movements: Extraocular movements intact.     Conjunctiva/sclera: Conjunctivae normal.     Pupils: Pupils are equal, round, and reactive to light.  Cardiovascular:     Rate and Rhythm: Normal rate and regular rhythm.     Heart sounds: No murmur heard.   Pulmonary:     Effort: Pulmonary effort is normal. No respiratory distress.     Breath sounds: Normal breath sounds. No wheezing or rales.  Musculoskeletal:     Cervical back: Normal range of motion and neck supple.  Skin:    General: Skin is warm and dry.     Capillary Refill: Capillary refill takes less than 2 seconds.  Neurological:     General: No focal deficit present.     Mental Status: She is alert and  oriented to person, place, and time. Mental status is at baseline.  Psychiatric:        Mood and Affect: Mood normal.        Behavior: Behavior normal.        Thought Content: Thought content normal.        Judgment: Judgment normal.     Results for orders placed or performed in visit on 09/10/20  Microscopic Examination   Urine  Result Value Ref Range   WBC, UA 0-5 0 - 5 /hpf   RBC 3-10 (A) 0 - 2 /hpf   Epithelial Cells (non renal) 0-10 0 - 10 /hpf   Mucus, UA Present (A) Not Estab.   Bacteria, UA None seen None seen/Few  Urine Culture   Specimen: Urine   UR  Result Value Ref Range   Urine Culture, Routine Final report    Organism ID, Bacteria Comment   CBC with Differential/Platelet  Result Value Ref Range   WBC 6.2 3.4 - 10.8 x10E3/uL   RBC 5.09 3.77 - 5.28 x10E6/uL   Hemoglobin 16.1 (H) 11.1 - 15.9  g/dL   Hematocrit 46.2 34.0 - 46.6 %   MCV 91 79 - 97 fL   MCH 31.6 26.6 - 33.0 pg   MCHC 34.8 31.5 - 35.7 g/dL   RDW 11.8 11.7 - 15.4 %   Platelets 273 150 - 450 x10E3/uL   Neutrophils 60 Not Estab. %   Lymphs 28 Not Estab. %   Monocytes 10 Not Estab. %   Eos 1 Not Estab. %   Basos 1 Not Estab. %   Neutrophils Absolute 3.7 1.4 - 7.0 x10E3/uL   Lymphocytes Absolute 1.7 0.7 - 3.1 x10E3/uL   Monocytes Absolute 0.6 0.1 - 0.9 x10E3/uL   EOS (ABSOLUTE) 0.1 0.0 - 0.4 x10E3/uL   Basophils Absolute 0.0 0.0 - 0.2 x10E3/uL   Immature Granulocytes 0 Not Estab. %   Immature Grans (Abs) 0.0 0.0 - 0.1 x10E3/uL  Comprehensive metabolic panel  Result Value Ref Range   Glucose 98 65 - 99 mg/dL   BUN 21 6 - 24 mg/dL   Creatinine, Ser 0.87 0.57 - 1.00 mg/dL   eGFR 78 >59 mL/min/1.73   BUN/Creatinine Ratio 24 (H) 9 - 23   Sodium 139 134 - 144 mmol/L   Potassium 4.1 3.5 - 5.2 mmol/L   Chloride 102 96 - 106 mmol/L   CO2 24 20 - 29 mmol/L   Calcium 9.6 8.7 - 10.2 mg/dL   Total Protein 6.4 6.0 - 8.5 g/dL   Albumin 4.4 3.8 - 4.9 g/dL   Globulin, Total 2.0 1.5 - 4.5 g/dL   Albumin/Globulin Ratio 2.2 1.2 - 2.2   Bilirubin Total 0.4 0.0 - 1.2 mg/dL   Alkaline Phosphatase 63 44 - 121 IU/L   AST 28 0 - 40 IU/L   ALT 30 0 - 32 IU/L  Lipid panel  Result Value Ref Range   Cholesterol, Total 206 (H) 100 - 199 mg/dL   Triglycerides 80 0 - 149 mg/dL   HDL 44 >39 mg/dL   VLDL Cholesterol Cal 14 5 - 40 mg/dL   LDL Chol Calc (NIH) 148 (H) 0 - 99 mg/dL   Chol/HDL Ratio 4.7 (H) 0.0 - 4.4 ratio  TSH  Result Value Ref Range   TSH 1.440 0.450 - 4.500 uIU/mL  Urinalysis, Routine w reflex microscopic  Result Value Ref Range   Specific Gravity, UA >1.030 (H) 1.005 - 1.030   pH, UA 6.0  5.0 - 7.5   Color, UA Yellow Yellow   Appearance Ur Cloudy (A) Clear   Leukocytes,UA Trace (A) Negative   Protein,UA 1+ (A) Negative/Trace   Glucose, UA Negative Negative   Ketones, UA Negative Negative   RBC, UA 3+ (A)  Negative   Bilirubin, UA Negative Negative   Urobilinogen, Ur 0.2 0.2 - 1.0 mg/dL   Nitrite, UA Negative Negative   Microscopic Examination See below:       Assessment & Plan:   Problem List Items Addressed This Visit   None      Follow up plan: No follow-ups on file.  A total of 30 minutes were spent on this encounter today.  When total time is documented, this includes both the face-to-face and non-face-to-face time personally spent before, during and after the visit on the date of the encounter.

## 2020-10-23 ENCOUNTER — Ambulatory Visit: Payer: Managed Care, Other (non HMO) | Admitting: Nurse Practitioner

## 2020-10-24 ENCOUNTER — Ambulatory Visit: Payer: Managed Care, Other (non HMO) | Admitting: Nurse Practitioner

## 2020-10-27 ENCOUNTER — Ambulatory Visit: Payer: Managed Care, Other (non HMO) | Admitting: Nurse Practitioner

## 2020-12-17 ENCOUNTER — Other Ambulatory Visit: Payer: Self-pay | Admitting: Nurse Practitioner

## 2020-12-17 NOTE — Addendum Note (Signed)
Addended by: Jon Billings on: 12/17/2020 08:58 AM   Modules accepted: Level of Service

## 2020-12-17 NOTE — Telephone Encounter (Signed)
I billed a physical on 09/10/20.

## 2020-12-17 NOTE — Telephone Encounter (Signed)
Patient following up regarding claim being re submitted. Patient states insurance will not accept the letter and informed the practice the claim needed to be resubmitted, patient states she feels like she is getting the run around and would like a follow up call best # [phone]

## 2020-12-17 NOTE — Telephone Encounter (Signed)
Routing to provider in regards to letter pt has physical 09/10/20. Routing to [place] in regards to claim

## 2020-12-17 NOTE — Telephone Encounter (Signed)
Requested Prescriptions  Pending Prescriptions Disp Refills  . sertraline (ZOLOFT) 50 MG tablet [Pharmacy Med Name: SERTRALINE 50MG  TABLETS] 90 tablet 1    Sig: TAKE 1 TABLET(50 MG) BY MOUTH DAILY     Psychiatry:  Antidepressants - SSRI Passed - 12/17/2020  3:19 AM      Passed - Valid encounter within last 6 months    Recent Outpatient Visits          2 months ago Adjustment disorder with mixed anxiety and depressed mood   [place], NP   3 months ago Annual physical exam   [place], NP   5 months ago Essential hypertension   [place], [place], DO   11 months ago Essential hypertension    [facility] [employer], [place]   1 year ago Essential hypertension   [facility], [place]      Future Appointments            In 2 days Mc[place], Scheryl Darter, NP [facility], [place]

## 2020-12-19 ENCOUNTER — Encounter: Payer: Self-pay | Admitting: Nurse Practitioner

## 2020-12-19 ENCOUNTER — Other Ambulatory Visit: Payer: Self-pay

## 2020-12-19 ENCOUNTER — Ambulatory Visit: Payer: Managed Care, Other (non HMO) | Admitting: Nurse Practitioner

## 2020-12-19 VITALS — BP 95/65 | HR 84 | Temp 97.5°F | Ht 68.0 in | Wt 169.4 lb

## 2020-12-19 DIAGNOSIS — R3121 Asymptomatic microscopic hematuria: Secondary | ICD-10-CM | POA: Diagnosis not present

## 2020-12-19 DIAGNOSIS — Z720 Tobacco use: Secondary | ICD-10-CM | POA: Diagnosis not present

## 2020-12-19 DIAGNOSIS — F4323 Adjustment disorder with mixed anxiety and depressed mood: Secondary | ICD-10-CM | POA: Diagnosis not present

## 2020-12-19 LAB — URINALYSIS, ROUTINE W REFLEX MICROSCOPIC
Bilirubin, UA: NEGATIVE
Glucose, UA: NEGATIVE
Ketones, UA: NEGATIVE
Leukocytes,UA: NEGATIVE
Nitrite, UA: NEGATIVE
Protein,UA: NEGATIVE
Specific Gravity, UA: >1.03 — ABNORMAL HIGH (ref 1.005–1.030)
Urobilinogen, Ur: 0.2 mg/dL (ref 0.2–1.0)
pH, UA: 5.5 (ref 5.0–7.5)

## 2020-12-19 LAB — MICROSCOPIC EXAMINATION
Bacteria, UA: NONE SEEN
WBC, UA: NONE SEEN /hpf (ref 0–5)

## 2020-12-19 NOTE — Progress Notes (Signed)
Established Patient Office Visit  Subjective:  Patient ID: Tiffany Mata, female    DOB: 02/14/1966  Age: 55 y.o. MRN: [id number]  CC:  Chief Complaint  Patient presents with   Anxiety    2 month f/up    Depression    HPI Tiffany Mata presents for follow-up on depression and anxiety.   ANXIETY/STRESS  Her anxiety symptoms have improved with the increase in dose of zoloft. She denies any side effects. She has also been exercising using the treadmill and stair stepper 5-6 times a week. She denies shortness of breath, chest pain, and palpitations.   Depression screen [facility] 2/9 12/19/2020 10/08/2020 09/10/2020 08/10/2019 08/05/2017  Decreased Interest 0 0 0 0 0  Down, Depressed, Hopeless 0 0 0 0 0  PHQ - 2 Score 0 0 0 0 0  Altered sleeping 0 - - 0 0  Tired, decreased energy 0 - - 0 0  Change in appetite 0 - - 0 0  Feeling bad or failure about yourself  0 - - 0 0  Trouble concentrating 0 - - 0 0  Moving slowly or fidgety/restless 0 - - 0 0  Suicidal thoughts 0 - - 0 0  PHQ-9 Score 0 - - 0 0  Difficult doing work/chores Not difficult at all - - - -   GAD 7 : Generalized Anxiety Score 12/19/2020 10/08/2020 09/10/2020 08/10/2019  Nervous, Anxious, on Edge 0 0 1 0  Control/stop worrying 0 0 1 0  Worry too much - different things 0 0 1 0  Trouble relaxing 0 0 0 0  Restless 0 0 0 0  Easily annoyed or irritable 0 0 0 0  Afraid - awful might happen 0 0 0 0  Total GAD 7 Score 0 0 3 0  Anxiety Difficulty Not difficult at all Not difficult at all - -    Past Medical History:  Diagnosis Date   GERD (gastroesophageal reflux disease)    occ   Stress     Past Surgical History:  Procedure Laterality Date   CESAREAN SECTION     COLONOSCOPY WITH PROPOFOL N/A 09/10/2019   Procedure: COLONOSCOPY WITH PROPOFOL;  Surgeon: Jonathon Bellows, MD;  Location: [facility] ENDOSCOPY;  Service: Gastroenterology;  Laterality: N/A;  Priority 4   DILATION AND CURETTAGE OF UTERUS     EYE SURGERY     FOOT SURGERY      HALLUX VALGUS CORRECTION     KNEE ARTHROSCOPY WITH MEDIAL MENISECTOMY Right 06/13/2018   Procedure: KNEE ARTHROSCOPY WITH MEDIAL MENISECTOMY;  Surgeon: Thornton Park, MD;  Location: [facility] ORS;  Service: Orthopedics;  Laterality: Right;   REFRACTIVE SURGERY     TOE SURGERY     TONSILLECTOMY AND ADENOIDECTOMY      Family History  Problem Relation Age of Onset   Stroke Mother    Hypertension Mother    Cancer Mother        Lung   Diabetes Father     Social History   Socioeconomic History   Marital status: Married    Spouse name: Not on file   Number of children: Not on file   Years of education: Not on file   Highest education level: Not on file  Occupational History   Not on file  Tobacco Use   Smoking status: Some Days    Years: 20.00    Pack years: 0.00    Types: Cigarettes   Smokeless tobacco: Never   Tobacco comments:  very light smoker  Vaping Use   Vaping Use: Never used  Substance and Sexual Activity   Alcohol use: No    Alcohol/week: 0.0 standard drinks   Drug use: No   Sexual activity: Yes    Birth control/protection: None    Comment: Ablation  Other Topics Concern   Not on file  Social History Narrative   ** Merged History Encounter **       Married 2 daughters- ages 39 and 37 She works as a Art therapist, husband works for [employer] Strain: Not on Art therapist Insecurity: Not on file  Transportation Needs: Not on file  Physical Activity: Not on file  Stress: Not on file  Social Connections: Not on file  Intimate Partner Violence: Not on file    Outpatient Medications Prior to Visit  Medication Sig Dispense Refill   Biotin w/ Vitamins C & E (HAIR/SKIN/NAILS PO) Take 1 Dose by mouth.     Calcium Carbonate (CALCIUM 600 PO) Take 600 mg by mouth daily.      ibuprofen (ADVIL) 800 MG tablet TAKE 1 TABLET(800 MG) BY MOUTH EVERY 8 HOURS AS NEEDED 60 tablet 2   losartan-hydrochlorothiazide (HYZAAR)  50-12.5 MG tablet Take 1 tablet by mouth daily. 90 tablet 1   POTASSIUM PO Take 1 tablet by mouth daily.      sertraline (ZOLOFT) 50 MG tablet TAKE 1 TABLET(50 MG) BY MOUTH DAILY 90 tablet 1   No facility-administered medications prior to visit.    No Known Allergies  ROS Review of Systems  Constitutional: Negative.   HENT: Negative.    Respiratory: Negative.    Cardiovascular: Negative.   Gastrointestinal: Negative.   Genitourinary: Negative.   Musculoskeletal: Negative.   Skin: Negative.   Neurological: Negative.      Objective:    Physical Exam Vitals and nursing note reviewed.  Constitutional:      General: She is not in acute distress.    Appearance: Normal appearance.  HENT:     Head: Normocephalic and atraumatic.  Eyes:     Conjunctiva/sclera: Conjunctivae normal.  Cardiovascular:     Rate and Rhythm: Normal rate and regular rhythm.     Pulses: Normal pulses.     Heart sounds: Normal heart sounds.  Pulmonary:     Effort: Pulmonary effort is normal.     Breath sounds: Wheezing (bilateral) present.  Musculoskeletal:     Cervical back: Normal range of motion.     Right lower leg: No edema.     Left lower leg: No edema.  Skin:    General: Skin is warm and dry.  Neurological:     General: No focal deficit present.     Mental Status: She is alert and oriented to person, place, and time.  Psychiatric:        Mood and Affect: Mood normal.        Behavior: Behavior normal.        Thought Content: Thought content normal.        Judgment: Judgment normal.    BP 95/65   Pulse 84   Temp (!) 97.5 F (36.4 C) (Oral)   Ht '5\' 8"'  (1.727 m)   Wt 169 lb 6.4 oz (76.8 kg)   SpO2 98%   BMI 25.76 kg/m  Wt Readings from Last 3 Encounters:  12/19/20 169 lb 6.4 oz (76.8 kg)  10/08/20 167 lb 7 oz (75.9 kg)  09/10/20  166 lb 3.2 oz (75.4 kg)     Health Maintenance Due  Topic Date Due   Zoster Vaccines- Shingrix (1 of 2) Never done   MAMMOGRAM  08/02/2019     There are no preventive care reminders to display for this patient.  Lab Results  Component Value Date   TSH 1.440 09/10/2020   Lab Results  Component Value Date   WBC 6.2 09/10/2020   HGB 16.1 (H) 09/10/2020   HCT 46.2 09/10/2020   MCV 91 09/10/2020   PLT 273 09/10/2020   Lab Results  Component Value Date   NA 139 09/10/2020   K 4.1 09/10/2020   CO2 24 09/10/2020   GLUCOSE 98 09/10/2020   BUN 21 09/10/2020   CREATININE 0.87 09/10/2020   BILITOT 0.4 09/10/2020   ALKPHOS 63 09/10/2020   AST 28 09/10/2020   ALT 30 09/10/2020   PROT 6.4 09/10/2020   ALBUMIN 4.4 09/10/2020   CALCIUM 9.6 09/10/2020   ANIONGAP 8 06/07/2018   EGFR 78 09/10/2020   GFR 66.88 09/10/2014   Lab Results  Component Value Date   CHOL 206 (H) 09/10/2020   Lab Results  Component Value Date   HDL 44 09/10/2020   Lab Results  Component Value Date   LDLCALC 148 (H) 09/10/2020   Lab Results  Component Value Date   TRIG 80 09/10/2020   Lab Results  Component Value Date   CHOLHDL 4.7 (H) 09/10/2020   No results found for: HGBA1C    Assessment & Plan:   Problem List Items Addressed This Visit       Other   Adjustment disorder with mixed anxiety and depressed mood    Stable. Symptoms improved with increasing zoloft to 32m daily. Will continue this regimen. PHQ-9 and GAD 7 both 0 today. Denies SI/HI. Follow-up in 6 months.        Tobacco abuse    She has decreased the amount she has been smoking and is working towards complete cessation. Congratulated her on this. If she has any concerns or questions, call the office.        Other Visit Diagnoses     Asymptomatic microscopic hematuria    -  Primary   Check u/a today. Denies abdominal pain, urinary frequency and dysuria. If still positive for hematuria, will refer to urology.    Relevant Orders   Urinalysis, Routine w reflex microscopic       No orders of the defined types were placed in this encounter.   Follow-up:  Return in about 6 months (around 06/20/2021) for htn, anxiety.    LCharyl Dancer NP

## 2020-12-19 NOTE — Assessment & Plan Note (Signed)
She has decreased the amount she has been smoking and is working towards complete cessation. Congratulated her on this. If she has any concerns or questions, call the office.

## 2020-12-19 NOTE — Addendum Note (Signed)
Addended by: Vance Peper A on: 12/19/2020 10:22 AM   Modules accepted: Orders

## 2020-12-19 NOTE — Assessment & Plan Note (Signed)
Stable. Symptoms improved with increasing zoloft to 50mg  daily. Will continue this regimen. PHQ-9 and GAD 7 both 0 today. Denies SI/HI. Follow-up in 6 months.

## 2020-12-26 ENCOUNTER — Other Ambulatory Visit: Payer: Self-pay

## 2020-12-26 ENCOUNTER — Ambulatory Visit (INDEPENDENT_AMBULATORY_CARE_PROVIDER_SITE_OTHER): Payer: Managed Care, Other (non HMO) | Admitting: Urology

## 2020-12-26 ENCOUNTER — Encounter: Payer: Self-pay | Admitting: Urology

## 2020-12-26 VITALS — BP 119/76 | HR 118 | Ht 68.0 in | Wt 166.0 lb

## 2020-12-26 DIAGNOSIS — R3129 Other microscopic hematuria: Secondary | ICD-10-CM

## 2020-12-26 LAB — MICROSCOPIC EXAMINATION: Bacteria, UA: NONE SEEN

## 2020-12-26 LAB — URINALYSIS, COMPLETE
Bilirubin, UA: NEGATIVE
Glucose, UA: NEGATIVE
Ketones, UA: NEGATIVE
Leukocytes,UA: NEGATIVE
Nitrite, UA: NEGATIVE
Protein,UA: NEGATIVE
Specific Gravity, UA: 1.02 (ref 1.005–1.030)
Urobilinogen, Ur: 0.2 mg/dL (ref 0.2–1.0)
pH, UA: 6 (ref 5.0–7.5)

## 2020-12-26 NOTE — Patient Instructions (Signed)
Cystoscopy Cystoscopy is a procedure that is used to help diagnose and sometimes treat conditions that affect the lower urinary tract. The lower urinary tract includes the bladder and the urethra. The urethra is the tube that drains urine from the bladder. Cystoscopy is done using a thin, tube-shaped instrument with a light and camera at the end (cystoscope). The cystoscope may be hard or flexible, depending on the goal of the procedure. The cystoscope is inserted through the urethra, into the bladder. Cystoscopy may be recommended if you have: Urinary tract infections that keep coming back. Blood in the urine (hematuria). An inability to control when you urinate (urinary incontinence) or an overactive bladder. Unusual cells found in a urine sample. A blockage in the urethra, such as a urinary stone. Painful urination. An abnormality in the bladder found during an intravenous pyelogram (IVP) or CT scan. Cystoscopy may also be done to remove a sample of tissue to be examined under a microscope (biopsy). What are the risks? Generally, this is a safe procedure. However, problems may occur, including: Infection. Bleeding.  What happens during the procedure?  You will be given one or more of the following: A medicine to numb the area (local anesthetic). The area around the opening of your urethra will be cleaned. The cystoscope will be passed through your urethra into your bladder. Germ-free (sterile) fluid will flow through the cystoscope to fill your bladder. The fluid will stretch your bladder so that your health care provider can clearly examine your bladder walls. Your doctor will look at the urethra and bladder. The cystoscope will be removed The procedure may vary among health care providers  What can I expect after the procedure? After the procedure, it is common to have: Some soreness or pain in your abdomen and urethra. Urinary symptoms. These include: Mild pain or burning when you  urinate. Pain should stop within a few minutes after you urinate. This may last for up to 1 week. A small amount of blood in your urine for several days. Feeling like you need to urinate but producing only a small amount of urine. Follow these instructions at home: General instructions Return to your normal activities as told by your health care provider.  Do not drive for 24 hours if you were given a sedative during your procedure. Watch for any blood in your urine. If the amount of blood in your urine increases, call your health care provider. If a tissue sample was removed for testing (biopsy) during your procedure, it is up to you to get your test results. Ask your health care provider, or the department that is doing the test, when your results will be ready. Drink enough fluid to keep your urine pale yellow. Keep all follow-up visits as told by your health care provider. This is important. Contact a health care provider if you: Have pain that gets worse or does not get better with medicine, especially pain when you urinate. Have trouble urinating. Have more blood in your urine. Get help right away if you: Have blood clots in your urine. Have abdominal pain. Have a fever or chills. Are unable to urinate. Summary Cystoscopy is a procedure that is used to help diagnose and sometimes treat conditions that affect the lower urinary tract. Cystoscopy is done using a thin, tube-shaped instrument with a light and camera at the end. After the procedure, it is common to have some soreness or pain in your abdomen and urethra. Watch for any blood in your urine.   If the amount of blood in your urine increases, call your health care provider. If you were prescribed an antibiotic medicine, take it as told by your health care provider. Do not stop taking the antibiotic even if you start to feel better. This information is not intended to replace advice given to you by your health care provider. Make  sure you discuss any questions you have with your health care provider. Document Revised: 06/06/2018 Document Reviewed: 06/06/2018 Elsevier Patient Education  2020 Elsevier Inc.  

## 2020-12-26 NOTE — Progress Notes (Signed)
12/26/2020 11:03 AM   Tiffany Mata 1965/09/06 [id number]  Referring provider: Charyl Dancer, NP [address] [place],  [place] [address]  Chief Complaint  Patient presents with   New Patient (Initial Visit)   Hematuria    HPI: Tiffany Mata is a 55 y.o. female referred for evaluation of microhematuria.  UAs dating back to 05/2019 and have consistently shown 3-10 RBCs/hpf with no significant epithelial cells Denies gross hematuria No bothersome LUTS Denies flank, abdominal or pelvic pain States she did see Dr. Yves Dill several years ago for recurrent UTIs and gross hematuria and apparently had negative cystoscopy and imaging No recent UTIs Noncontrast CT 2019 showed no urinary tract calculi or GU abnormalities She has smoked less than 1/2 pack cigarettes per day for the last 20 years and presently smoking 3-4 cigarettes/day   PMH: Past Medical History:  Diagnosis Date   GERD (gastroesophageal reflux disease)    occ   Stress     Surgical History: Past Surgical History:  Procedure Laterality Date   CESAREAN SECTION     COLONOSCOPY WITH PROPOFOL N/A 09/10/2019   Procedure: COLONOSCOPY WITH PROPOFOL;  Surgeon: Jonathon Bellows, MD;  Location: [facility] ENDOSCOPY;  Service: Gastroenterology;  Laterality: N/A;  Priority 4   DILATION AND CURETTAGE OF UTERUS     EYE SURGERY     FOOT SURGERY     HALLUX VALGUS CORRECTION     KNEE ARTHROSCOPY WITH MEDIAL MENISECTOMY Right 06/13/2018   Procedure: KNEE ARTHROSCOPY WITH MEDIAL MENISECTOMY;  Surgeon: Thornton Park, MD;  Location: [facility] ORS;  Service: Orthopedics;  Laterality: Right;   REFRACTIVE SURGERY     TOE SURGERY     TONSILLECTOMY AND ADENOIDECTOMY      Home Medications:  Allergies as of 12/26/2020   No Known Allergies      Medication List        Accurate as of December 26, 2020 11:03 AM. If you have any questions, ask your nurse or doctor.          CALCIUM 600 PO Take 600 mg by mouth daily.   HAIR/SKIN/NAILS PO Take 1  Dose by mouth.   ibuprofen 800 MG tablet Commonly known as: ADVIL TAKE 1 TABLET(800 MG) BY MOUTH EVERY 8 HOURS AS NEEDED   losartan-hydrochlorothiazide 50-12.5 MG tablet Commonly known as: HYZAAR Take 1 tablet by mouth daily.   POTASSIUM PO Take 1 tablet by mouth daily.   sertraline 50 MG tablet Commonly known as: ZOLOFT TAKE 1 TABLET(50 MG) BY MOUTH DAILY        Allergies: No Known Allergies  Family History: Family History  Problem Relation Age of Onset   Stroke Mother    Hypertension Mother    Cancer Mother        Lung   Diabetes Father     Social History:  reports that she has been smoking cigarettes. She has never used smokeless tobacco. She reports that she does not drink alcohol and does not use drugs.   Physical Exam: BP 119/76   Pulse (!) 118   Ht 5\' 8"  (1.727 m)   Wt 166 lb (75.3 kg)   BMI 25.24 kg/m   Constitutional:  Alert and oriented, No acute distress. HEENT: [place] AT, moist mucus membranes.  Trachea midline, no masses. Cardiovascular: No clubbing, cyanosis, or edema. Respiratory: Normal respiratory effort, no increased work of breathing. Skin: No rashes, bruises or suspicious lesions. Neurologic: Grossly intact, no focal deficits, moving all 4 extremities. Psychiatric: Normal mood  and affect.  Laboratory Data: UA today negative microscopy   Assessment & Plan:    1. Microscopic hematuria AUA hematuria risk stratification: Intermediate We discussed the recommend evaluation for intermediate risk hematuria to include renal ultrasound and cystoscopy The procedures were discussed and she has elected to proceed with further evaluation RUS entered  She will return for cystoscopy and ultrasound review  Abbie Sons, MD  [facility] [address] [place], [place] [address] [phone]

## 2020-12-29 ENCOUNTER — Encounter: Payer: Self-pay | Admitting: Urology

## 2021-01-01 ENCOUNTER — Other Ambulatory Visit: Payer: Self-pay | Admitting: Family Medicine

## 2021-01-01 NOTE — Telephone Encounter (Signed)
Spoke with patient and advised that claim was held until provider was credentialed.  Contacted Nunzio Cory with [employer]  and requested that the patient's claims be filed for 08/2020.

## 2021-01-05 ENCOUNTER — Other Ambulatory Visit: Payer: Self-pay | Admitting: Nurse Practitioner

## 2021-01-05 MED ORDER — IBUPROFEN 800 MG PO TABS
ORAL_TABLET | ORAL | 2 refills | Status: AC
Start: 2021-01-05 — End: ?

## 2021-01-05 NOTE — Telephone Encounter (Signed)
Copied from [place] [id number]. Topic: Quick Communication - Rx Refill/Question >> Jan 05, 2021 11:02 AM Yvette Rack wrote: Pt stated during her appt with Jon Billings she was told the Rx would be sent but her pharmacy has not received it.   Medication: ibuprofen (ADVIL) 800 MG tablet  Has the patient contacted their pharmacy? Yes.   (Agent: If no, request that the patient contact the pharmacy for the refill.) (Agent: If yes, when and what did the pharmacy advise?)  Preferred Pharmacy (with phone number or street name): [id number] - [place], [address]   Phone: [phone]  Fax: [phone]  Agent: Please be advised that RX refills may take up to 3 business days. We ask that you follow-up with your pharmacy.

## 2021-01-14 ENCOUNTER — Ambulatory Visit
Admission: RE | Admit: 2021-01-14 | Discharge: 2021-01-14 | Disposition: A | Discharge status: Home / Self Care | Payer: Managed Care, Other (non HMO) | Source: Ambulatory Visit | Attending: Urology | Admitting: Urology

## 2021-01-14 ENCOUNTER — Other Ambulatory Visit: Payer: Self-pay

## 2021-01-14 DIAGNOSIS — R3129 Other microscopic hematuria: Secondary | ICD-10-CM | POA: Insufficient documentation

## 2021-01-22 ENCOUNTER — Other Ambulatory Visit: Payer: Self-pay

## 2021-01-22 ENCOUNTER — Ambulatory Visit (INDEPENDENT_AMBULATORY_CARE_PROVIDER_SITE_OTHER): Payer: Managed Care, Other (non HMO) | Admitting: Urology

## 2021-01-22 ENCOUNTER — Encounter: Payer: Self-pay | Admitting: Urology

## 2021-01-22 VITALS — BP 140/86 | HR 80 | Ht 65.0 in | Wt 165.0 lb

## 2021-01-22 DIAGNOSIS — R3129 Other microscopic hematuria: Secondary | ICD-10-CM

## 2021-01-22 NOTE — Progress Notes (Signed)
   01/22/21  CC:  Chief Complaint  Patient presents with   Cysto    HPI: No complaints since last visit 12/26/2020.  RUS with a 5 x 4.8 x 4.8 cm simple left renal cyst  See rooming tab for vitals NED. A&Ox3.   No respiratory distress   Abd soft, NT, ND Normal external genitalia with patent urethral meatus  Cystoscopy Procedure Note  Patient identification was confirmed, informed consent was obtained, and patient was prepped using Betadine solution.  Lidocaine jelly was administered per urethral meatus.    Procedure: - Flexible cystoscope introduced, without any difficulty.   - Thorough search of the bladder revealed:    normal urethral meatus    normal urothelium    no stones    no ulcers     no tumors    no urethral polyps    no trabeculation  - Ureteral orifices were normal in position and appearance.  Post-Procedure: - Patient tolerated the procedure well  Assessment/ Plan: No mucosal abnormalities on cystoscopy Simple renal cyst on ultrasound 24-monthfollow-up repeat UA   SAbbie Sons MD

## 2021-01-23 LAB — URINALYSIS, COMPLETE
Bilirubin, UA: NEGATIVE
Glucose, UA: NEGATIVE
Ketones, UA: NEGATIVE
Nitrite, UA: NEGATIVE
Protein,UA: NEGATIVE
Specific Gravity, UA: 1.025 (ref 1.005–1.030)
Urobilinogen, Ur: 0.2 mg/dL (ref 0.2–1.0)
pH, UA: 6 (ref 5.0–7.5)

## 2021-01-23 LAB — MICROSCOPIC EXAMINATION: Epithelial Cells (non renal): >10 /hpf — AB (ref 0–10)

## 2021-02-24 ENCOUNTER — Telehealth: Payer: Self-pay

## 2021-02-24 ENCOUNTER — Encounter: Payer: Self-pay | Admitting: Family Medicine

## 2021-02-24 ENCOUNTER — Telehealth (INDEPENDENT_AMBULATORY_CARE_PROVIDER_SITE_OTHER): Payer: Managed Care, Other (non HMO) | Admitting: Family Medicine

## 2021-02-24 DIAGNOSIS — N3001 Acute cystitis with hematuria: Secondary | ICD-10-CM

## 2021-02-24 DIAGNOSIS — R300 Dysuria: Secondary | ICD-10-CM

## 2021-02-24 LAB — MICROSCOPIC EXAMINATION

## 2021-02-24 LAB — URINALYSIS, ROUTINE W REFLEX MICROSCOPIC
Bilirubin, UA: NEGATIVE
Glucose, UA: NEGATIVE
Ketones, UA: NEGATIVE
Nitrite, UA: NEGATIVE
Protein,UA: NEGATIVE
Specific Gravity, UA: 1.01 (ref 1.005–1.030)
Urobilinogen, Ur: 0.2 mg/dL (ref 0.2–1.0)
pH, UA: 5 (ref 5.0–7.5)

## 2021-02-24 MED ORDER — CIPROFLOXACIN HCL 500 MG PO TABS: 500.0000 mg | ORAL_TABLET | Freq: Two times a day (BID) | ORAL | 0 refills | Status: DC

## 2021-02-24 NOTE — Progress Notes (Signed)
There were no vitals taken for this visit.   Subjective:    Patient ID: Tiffany Mata, female    DOB: 06/26/1966, 55 y.o.   MRN: [id number]  HPI: Tiffany Mata is a 55 y.o. female  Chief Complaint  Patient presents with   Urinary Frequency    Started on Sunday, with pain on her right side. With urgency.   URINARY SYMPTOMS Duration: 3 days Dysuria:  a little bit Urinary frequency: yes Urgency: yes Small volume voids: yes Symptom severity: moderate Urinary incontinence: no Foul odor: yes Hematuria: no Abdominal pain: no Back pain: no Suprapubic pain/pressure: no Flank pain: yes Fever:  no Vomiting: no Relief with cranberry juice: no Relief with pyridium: no Status: stable History of sexually transmitted disease: no Vaginal discharge: no Treatments attempted: increasing fluids    Relevant past medical, surgical, family and social history reviewed and updated as indicated. Interim medical history since our last visit reviewed. Allergies and medications reviewed and updated.  Review of Systems  Constitutional: Negative.   Respiratory: Negative.    Cardiovascular: Negative.   Gastrointestinal: Negative.   Genitourinary:  Positive for dysuria, frequency and urgency. Negative for decreased urine volume, difficulty urinating, dyspareunia, enuresis, flank pain, genital sores, hematuria, menstrual problem, pelvic pain, vaginal bleeding, vaginal discharge and vaginal pain.  Neurological: Negative.   Psychiatric/Behavioral: Negative.     Per HPI unless specifically indicated above     Objective:    There were no vitals taken for this visit.  Wt Readings from Last 3 Encounters:  01/22/21 165 lb (74.8 kg)  12/26/20 166 lb (75.3 kg)  12/19/20 169 lb 6.4 oz (76.8 kg)    Physical Exam Vitals and nursing note reviewed.  Constitutional:      General: She is not in acute distress.    Appearance: Normal appearance. She is not ill-appearing, toxic-appearing or diaphoretic.   HENT:     Head: Normocephalic and atraumatic.     Right Ear: External ear normal.     Left Ear: External ear normal.     Nose: Nose normal.     Mouth/Throat:     Mouth: Mucous membranes are moist.     Pharynx: Oropharynx is clear.  Eyes:     General: No scleral icterus.       Right eye: No discharge.        Left eye: No discharge.     Conjunctiva/sclera: Conjunctivae normal.     Pupils: Pupils are equal, round, and reactive to light.  Pulmonary:     Effort: Pulmonary effort is normal. No respiratory distress.     Comments: Speaking in full sentences Musculoskeletal:        General: Normal range of motion.     Cervical back: Normal range of motion.  Skin:    Coloration: Skin is not jaundiced or pale.     Findings: No bruising, erythema, lesion or rash.  Neurological:     Mental Status: She is alert and oriented to person, place, and time. Mental status is at baseline.  Psychiatric:        Mood and Affect: Mood normal.        Behavior: Behavior normal.        Thought Content: Thought content normal.        Judgment: Judgment normal.    Results for orders placed or performed in visit on 01/22/21  Microscopic Examination   Urine  Result Value Ref Range   WBC, UA 6-10 (A) 0 -  5 /hpf   RBC 3-10 (A) 0 - 2 /hpf   Epithelial Cells (non renal) >10 (A) 0 - 10 /hpf   Bacteria, UA Few None seen/Few  Urinalysis, Complete  Result Value Ref Range   Specific Gravity, UA 1.025 1.005 - 1.030   pH, UA 6.0 5.0 - 7.5   Color, UA Yellow Yellow   Appearance Ur Hazy (A) Clear   Leukocytes,UA 1+ (A) Negative   Protein,UA Negative Negative/Trace   Glucose, UA Negative Negative   Ketones, UA Negative Negative   RBC, UA 1+ (A) Negative   Bilirubin, UA Negative Negative   Urobilinogen, Ur 0.2 0.2 - 1.0 mg/dL   Nitrite, UA Negative Negative   Microscopic Examination See below:       Assessment & Plan:   Problem List Items Addressed This Visit   None Visit Diagnoses     Acute  cystitis with hematuria    -  Primary   Will treat with cipro given flank pain. Await culture. Call if not getting better and we'll consider imaging to r/o kidney stone.    Dysuria       1+ leuk, 2+ blood   Relevant Orders   Urinalysis, Routine w reflex microscopic        Follow up plan: Return if symptoms worsen or fail to improve.   This visit was completed via video visit through MyChart due to the restrictions of the COVID-19 pandemic. All issues as above were discussed and addressed. Physical exam was done as above through visual confirmation on video through MyChart. If it was felt that the patient should be evaluated in the office, they were directed there. The patient verbally consented to this visit. Location of the patient: home Location of the provider: work Those involved with this call:  Provider: Park Liter, DO CMA: Frazier Butt, CMA Front Desk/Registration: Barth Kirks  Time spent on call:  15 minutes with patient face to face via video conference. More than 50% of this time was spent in counseling and coordination of care. 23 minutes total spent in review of patient's record and preparation of their chart.

## 2021-02-24 NOTE — Telephone Encounter (Signed)
Appointment scheduled.

## 2021-02-24 NOTE — Telephone Encounter (Signed)
Copied from [place] [id number]. Topic: General - Other >> Feb 24, 2021  8:59 AM Leward Quan A wrote: Reason for CRM: Patient called in to say that she may have a UTI frequent trips to the restroom and no urine, irritated vagina needing to come by with a urine sample Please advise call ASAP at  Ph# [phone]   Please call and schedule patient a visit.

## 2021-02-24 NOTE — Addendum Note (Signed)
Addended by: Valerie Roys on: 02/24/2021 04:12 PM   Modules accepted: Orders

## 2021-02-28 LAB — URINE CULTURE

## 2021-03-27 ENCOUNTER — Telehealth: Payer: Managed Care, Other (non HMO)

## 2021-03-27 ENCOUNTER — Telehealth: Payer: Managed Care, Other (non HMO) | Admitting: Physician Assistant

## 2021-03-27 DIAGNOSIS — J208 Acute bronchitis due to other specified organisms: Secondary | ICD-10-CM | POA: Diagnosis not present

## 2021-03-27 DIAGNOSIS — B9689 Other specified bacterial agents as the cause of diseases classified elsewhere: Secondary | ICD-10-CM | POA: Diagnosis not present

## 2021-03-27 MED ORDER — AZITHROMYCIN 250 MG PO TABS: ORAL_TABLET | ORAL | 0 refills | Status: DC

## 2021-03-27 MED ORDER — ALBUTEROL SULFATE HFA 108 (90 BASE) MCG/ACT IN AERS: 2.0000 | INHALATION_SPRAY | Freq: Four times a day (QID) | RESPIRATORY_TRACT | 0 refills | Status: AC | PRN

## 2021-03-27 MED ORDER — PREDNISONE 20 MG PO TABS: 20.0000 mg | ORAL_TABLET | Freq: Every day | ORAL | 0 refills | Status: DC

## 2021-03-27 MED ORDER — BENZONATATE 100 MG PO CAPS: 100.0000 mg | ORAL_CAPSULE | Freq: Three times a day (TID) | ORAL | 0 refills | Status: AC | PRN

## 2021-03-27 NOTE — Progress Notes (Signed)
Virtual Visit Consent   Tiffany Mata, you are scheduled for a virtual visit with a [facility] provider today.     Just as with appointments in the office, your consent must be obtained to participate.  Your consent will be active for this visit and any virtual visit you may have with one of our providers in the next 365 days.     If you have a MyChart account, a copy of this consent can be sent to you electronically.  All virtual visits are billed to your insurance company just like a traditional visit in the office.    As this is a virtual visit, video technology does not allow for your provider to perform a traditional examination.  This may limit your provider's ability to fully assess your condition.  If your provider identifies any concerns that need to be evaluated in person or the need to arrange testing (such as labs, EKG, etc.), we will make arrangements to do so.     Although advances in technology are sophisticated, we cannot ensure that it will always work on either your end or our end.  If the connection with a video visit is poor, the visit may have to be switched to a telephone visit.  With either a video or telephone visit, we are not always able to ensure that we have a secure connection.     I need to obtain your verbal consent now.   Are you willing to proceed with your visit today?    Tiffany Mata has provided verbal consent on 03/27/2021 for a virtual visit (video or telephone).   Mar Daring, PA-C   Date: 03/27/2021 9:16 AM   Virtual Visit via Video Note   I, Mar Daring, connected with  Tiffany Mata  ([id number], July 29, 1965) on 03/27/21 at  8:45 AM EDT by a video-enabled telemedicine application and verified that I am speaking with the correct person using two identifiers.  Location: Patient: Virtual Visit Location Patient: Home Provider: Virtual Visit Location Provider: Home Office   I discussed the limitations of evaluation and management by  telemedicine and the availability of in person appointments. The patient expressed understanding and agreed to proceed.    History of Present Illness: Tiffany Mata is a 55 y.o. who identifies as a female who was assigned female at birth, and is being seen today for cough.  HPI: Cough This is a new problem. The current episode started 1 to 4 weeks ago (last wednesday). The problem has been gradually worsening. The problem occurs every few minutes. The cough is Productive of purulent sputum. Associated symptoms include nasal congestion, postnasal drip, rhinorrhea, a sore throat (from coughing), shortness of breath and wheezing. Pertinent negatives include no chills, ear congestion, ear pain, fever, headaches or myalgias. The symptoms are aggravated by lying down. Risk factors: sick contacts. Treatments tried: netti pot, zyrtec, mucinex, cough medicine. The treatment provided no relief.   Covid testing is negative  Problems:  Patient Active Problem List   Diagnosis Date Noted   Essential hypertension 09/26/2019   Allergic rhinitis 09/11/2018   Grief 08/14/2015   Insomnia 09/10/2014   Stress due to family tension 09/10/2014   Adjustment disorder with mixed anxiety and depressed mood 09/10/2014   Tobacco abuse 09/10/2014    Allergies: No Known Allergies Medications:  Current Outpatient Medications:    albuterol (VENTOLIN HFA) 108 (90 Base) MCG/ACT inhaler, Inhale 2 puffs into the lungs every 6 (six) hours as  needed for wheezing or shortness of breath., Disp: 8 g, Rfl: 0   azithromycin (ZITHROMAX) 250 MG tablet, Take 2 tablets PO on day one, and one tablet PO daily thereafter until completed., Disp: 6 tablet, Rfl: 0   benzonatate (TESSALON) 100 MG capsule, Take 1 capsule (100 mg total) by mouth 3 (three) times daily as needed., Disp: 30 capsule, Rfl: 0   predniSONE (DELTASONE) 20 MG tablet, Take 1 tablet (20 mg total) by mouth daily with breakfast., Disp: 5 tablet, Rfl: 0   Biotin w/ Vitamins C  & E (HAIR/SKIN/NAILS PO), Take 1 Dose by mouth., Disp: , Rfl:    Calcium Carbonate (CALCIUM 600 PO), Take 600 mg by mouth daily. , Disp: , Rfl:    ciprofloxacin (CIPRO) 500 MG tablet, Take 1 tablet (500 mg total) by mouth 2 (two) times daily., Disp: 14 tablet, Rfl: 0   ibuprofen (ADVIL) 800 MG tablet, TAKE 1 TABLET(800 MG) BY MOUTH EVERY 8 HOURS AS NEEDED, Disp: 60 tablet, Rfl: 2   losartan-hydrochlorothiazide (HYZAAR) 50-12.5 MG tablet, TAKE 1 TABLET BY MOUTH DAILY, Disp: 90 tablet, Rfl: 1   POTASSIUM PO, Take 1 tablet by mouth daily. , Disp: , Rfl:    sertraline (ZOLOFT) 50 MG tablet, TAKE 1 TABLET(50 MG) BY MOUTH DAILY, Disp: 90 tablet, Rfl: 1  Observations/Objective: Patient is well-developed, well-nourished in no acute distress.  Resting comfortably at home.  Head is normocephalic, atraumatic.  No labored breathing.  Speech is clear and coherent with logical content.  Patient is alert and oriented at baseline.    Assessment and Plan: 1. Acute bacterial bronchitis - azithromycin (ZITHROMAX) 250 MG tablet; Take 2 tablets PO on day one, and one tablet PO daily thereafter until completed.  Dispense: 6 tablet; Refill: 0 - benzonatate (TESSALON) 100 MG capsule; Take 1 capsule (100 mg total) by mouth 3 (three) times daily as needed.  Dispense: 30 capsule; Refill: 0 - albuterol (VENTOLIN HFA) 108 (90 Base) MCG/ACT inhaler; Inhale 2 puffs into the lungs every 6 (six) hours as needed for wheezing or shortness of breath.  Dispense: 8 g; Refill: 0 - predniSONE (DELTASONE) 20 MG tablet; Take 1 tablet (20 mg total) by mouth daily with breakfast.  Dispense: 5 tablet; Refill: 0  - Worsening over a week despite OTC medications - Will treat with zpak, prednisone, albuterol and tessalon perles - Push fluids.  - Rest.  - Call or seek in person evaluation if worsening.   Follow Up Instructions: I discussed the assessment and treatment plan with the patient. The patient was provided an opportunity to  ask questions and all were answered. The patient agreed with the plan and demonstrated an understanding of the instructions.  A copy of instructions were sent to the patient via MyChart unless otherwise noted below.   The patient was advised to call back or seek an in-person evaluation if the symptoms worsen or if the condition fails to improve as anticipated.  Time:  I spent 16 minutes with the patient via telehealth technology discussing the above problems/concerns.    Mar Daring, PA-C

## 2021-03-27 NOTE — Patient Instructions (Signed)
[name], thank you for joining Mar Daring, PA-C for today's virtual visit.  While this provider is not your primary care provider (PCP), if your PCP is located in our provider database this encounter information will be shared with them immediately following your visit.  Consent: (Patient) Tiffany Mata provided verbal consent for this virtual visit at the beginning of the encounter.  Current Medications:  Current Outpatient Medications:    albuterol (VENTOLIN HFA) 108 (90 Base) MCG/ACT inhaler, Inhale 2 puffs into the lungs every 6 (six) hours as needed for wheezing or shortness of breath., Disp: 8 g, Rfl: 0   azithromycin (ZITHROMAX) 250 MG tablet, Take 2 tablets PO on day one, and one tablet PO daily thereafter until completed., Disp: 6 tablet, Rfl: 0   benzonatate (TESSALON) 100 MG capsule, Take 1 capsule (100 mg total) by mouth 3 (three) times daily as needed., Disp: 30 capsule, Rfl: 0   predniSONE (DELTASONE) 20 MG tablet, Take 1 tablet (20 mg total) by mouth daily with breakfast., Disp: 5 tablet, Rfl: 0   Biotin w/ Vitamins C & E (HAIR/SKIN/NAILS PO), Take 1 Dose by mouth., Disp: , Rfl:    Calcium Carbonate (CALCIUM 600 PO), Take 600 mg by mouth daily. , Disp: , Rfl:    ciprofloxacin (CIPRO) 500 MG tablet, Take 1 tablet (500 mg total) by mouth 2 (two) times daily., Disp: 14 tablet, Rfl: 0   ibuprofen (ADVIL) 800 MG tablet, TAKE 1 TABLET(800 MG) BY MOUTH EVERY 8 HOURS AS NEEDED, Disp: 60 tablet, Rfl: 2   losartan-hydrochlorothiazide (HYZAAR) 50-12.5 MG tablet, TAKE 1 TABLET BY MOUTH DAILY, Disp: 90 tablet, Rfl: 1   POTASSIUM PO, Take 1 tablet by mouth daily. , Disp: , Rfl:    sertraline (ZOLOFT) 50 MG tablet, TAKE 1 TABLET(50 MG) BY MOUTH DAILY, Disp: 90 tablet, Rfl: 1   Medications ordered in this encounter:  Meds ordered this encounter  Medications   azithromycin (ZITHROMAX) 250 MG tablet    Sig: Take 2 tablets PO on day one, and one tablet PO daily thereafter until  completed.    Dispense:  6 tablet    Refill:  0    Order Specific Question:   Supervising Provider    Answer:   MILLER, BRIAN [[id number]]   benzonatate (TESSALON) 100 MG capsule    Sig: Take 1 capsule (100 mg total) by mouth 3 (three) times daily as needed.    Dispense:  30 capsule    Refill:  0    Order Specific Question:   Supervising Provider    Answer:   MILLER, BRIAN [[id number]]   albuterol (VENTOLIN HFA) 108 (90 Base) MCG/ACT inhaler    Sig: Inhale 2 puffs into the lungs every 6 (six) hours as needed for wheezing or shortness of breath.    Dispense:  8 g    Refill:  0    Order Specific Question:   Supervising Provider    Answer:   MILLER, BRIAN [[id number]]   predniSONE (DELTASONE) 20 MG tablet    Sig: Take 1 tablet (20 mg total) by mouth daily with breakfast.    Dispense:  5 tablet    Refill:  0    Order Specific Question:   Supervising Provider    Answer:   Sabra Heck, BRIAN [[id number]]     *If you need refills on other medications prior to your next appointment, please contact your pharmacy*  Follow-Up: Call back or seek an in-person evaluation if the symptoms worsen  or if the condition fails to improve as anticipated.  Other Instructions Acute Bronchitis, Adult Acute bronchitis is when air tubes in the lungs (bronchi) suddenly get swollen. The condition can make it hard for you to breathe. In adults, acute bronchitis usually goes away within 2 weeks. A cough caused by bronchitis may last up to 3 weeks. Smoking, allergies, and asthma can make the condition worse. What are the causes? This condition is caused by: Cold and flu viruses. The most common cause of this condition is the virus that causes the common cold. Bacteria. Substances that irritate the lungs, including: Smoke from cigarettes and other types of tobacco. Dust and pollen. Fumes from chemicals, gases, or burned fuel. Other materials that pollute indoor or outdoor air. Close contact with someone who has acute bronchitis. What  increases the risk? The following factors may make you more likely to develop this condition: A weak body's defense system. This is also called the immune system. Any condition that affects your lungs and breathing, such as asthma. What are the signs or symptoms? Symptoms of this condition include: A cough. Coughing up clear, yellow, or green mucus. Wheezing. Having too much mucus in your lungs (chest congestion). Shortness of breath. A fever. Chills. Body aches. A sore throat. How is this treated? Acute bronchitis may go away over time without treatment. Your doctor may recommend: Drinking more fluids. Using a device that gets medicine into your lungs (inhaler). Using a vaporizer or a humidifier. These are machines that add water or moisture to the air. This helps with coughing and poor breathing. Taking a medicine for fever. Taking a medicine that thins mucus and clears congestion. Taking a medicine that prevents or stops coughing. Follow these instructions at home: Activity Get a lot of rest. Return to your normal activities as told by your doctor. Ask your doctor what activities are safe for you. Lifestyle  Drink enough fluid to keep your pee (urine) pale yellow. Do not drink alcohol. Do not use any products that contain nicotine or tobacco, such as cigarettes, e-cigarettes, and chewing tobacco. If you need help quitting, ask your doctor. Be aware that: Your bronchitis will get worse if you smoke or breathe in other people's smoke (secondhand smoke). Your lungs will heal faster if you quit smoking. General instructions Take over-the-counter and prescription medicines only as told by your doctor. Use an inhaler, cool mist vaporizer, or humidifier as told by your doctor. Rinse your mouth often with salt water. To make salt water, dissolve -1 tsp (3-6 g) of salt in 1 cup (237 mL) of warm water. Take two teaspoons of honey at bedtime. This helps lessen your coughing at  night. Keep all follow-up visits as told by your doctor. This is important. How is this prevented? To lower your risk of getting this condition again: Wash your hands often with soap and water. If you cannot use soap and water, use hand sanitizer. Avoid contact with people who have cold symptoms. Try not to touch your mouth, nose, or eyes with your hands. Make sure to get the flu shot every year. Contact a doctor if: Your symptoms do not get better in 2 weeks. You vomit more than once or twice. You have symptoms of loss of fluid from your body (dehydration). These include: Dark pee. Dry skin or eyes. Increased thirst. Headaches. Confusion. Muscle cramps. Get help right away if: You cough up blood. You have chest pain. You have very bad shortness of breath. You become dehydrated.  You faint or keep feeling like you are going to faint. You have a very bad headache. Your fever or chills get worse. These symptoms may be an emergency. Get help right away. Call your local emergency services (911 in the [place] Do not wait to see if the symptoms will go away. Do not drive yourself to the hospital. Summary Acute bronchitis is when air tubes in the lungs (bronchi) suddenly get swollen. In adults, acute bronchitis usually goes away within 2 weeks. Take over-the-counter and prescription medicines only as told by your doctor. Drink enough fluid to keep your pee (urine) pale yellow. Contact a doctor if your symptoms do not improve after 2 weeks of treatment. Get help right away if you cough up blood, faint, or have chest pain or shortness of breath. This information is not intended to replace advice given to you by your health care provider. Make sure you discuss any questions you have with your health care provider. Document Revised: 05/14/2020 Document Reviewed: 01/05/2019 Elsevier Patient Education  2022 [employer].    If you have been instructed to have an in-person evaluation today  at a local Urgent Care facility, please use the link below. It will take you to a list of all of our available [facility], including address, phone number and hours of operation. Please do not delay care.  [facility]  If you or a family member do not have a primary care provider, use the link below to schedule a visit and establish care. When you choose a [facility] primary care physician or advanced practice provider, you gain a long-term partner in health. Find a Primary Care Provider  Learn more about [facility]'s in-office and virtual care options: [facility]

## 2021-04-02 ENCOUNTER — Ambulatory Visit: Payer: Self-pay | Admitting: *Deleted

## 2021-04-02 NOTE — Telephone Encounter (Signed)
Pt called in c/o having a dizzy spell yesterday while driving and had to pull over and have her daughter drive.   She also vomited.   See triage notes.  I made her an appt with Jon Billings, NP 04/03/2021 at 9:00.

## 2021-04-02 NOTE — Telephone Encounter (Signed)
Reason for Disposition  [1] MODERATE dizziness (e.g., interferes with normal activities) AND [2] has NOT been evaluated by physician for this  (Exception: dizziness caused by heat exposure, sudden standing, or poor fluid intake)  Answer Assessment - Initial Assessment Questions 1. DESCRIPTION: "Describe your dizziness."     Pt called in.   Yesterday she was driving and had to pull over.   She got lightheaded.   I had my daughter to drive.   I was walking like I was drunk to the other side of the car and I vomited.    I feel ok this morning.   My BP was normal today.    Yesterday my BP 154/110.   I take BP medication daily.    I had a bad sinus infection.   I did a MyChart visit last Friday and started on antibiotics.   I'm better now except for coughing at night.  I've had slight headaches.  I cough a lot at night when I go to bed.    2. LIGHTHEADED: "Do you feel lightheaded?" (e.g., somewhat faint, woozy, weak upon standing)     See above.  I feel fine this morning.   3. VERTIGO: "Do you feel like either you or the room is spinning or tilting?" (i.e. vertigo)     No 4. SEVERITY: "How bad is it?"  "Do you feel like you are going to faint?" "Can you stand and walk?"   - MILD: Feels slightly dizzy, but walking normally.   - MODERATE: Feels unsteady when walking, but not falling; interferes with normal activities (e.g., school, work).   - SEVERE: Unable to walk without falling, or requires assistance to walk without falling; feels like passing out now.      I'm ok today.  I drove my daughter to school this morning and was fine. 5. ONSET:  "When did the dizziness begin?"     Yesterday about 6:00 PM.   It lasted the whole episode 2-3 hours.   When I got home after vomited I sit around until I felt better.  I don't have diabetes.   6. AGGRAVATING FACTORS: "Does anything make it worse?" (e.g., standing, change in head position)     Today I'm fine 7. HEART RATE: "Can you tell me your heart rate?" "How  many beats in 15 seconds?"  (Note: not all patients can do this)       Not asked 8. CAUSE: "What do you think is causing the dizziness?"     I don't know.     9. RECURRENT SYMPTOM: "Have you had dizziness before?" If Yes, ask: "When was the last time?" "What happened that time?"     No 10. OTHER SYMPTOMS: "Do you have any other symptoms?" (e.g., fever, chest pain, vomiting, diarrhea, bleeding)       No shortness of breath.   No sweating.   After I vomited I had my daughter turn on the [facility] in the car.    I had some chest pain right below my neck.   I just wanted to get my bra off.   It felt good to get it off.   It felt tight.   That lasted about 5 minutes.   The pain was in the center of my chest between my breasts.  It lasted 5 minutes or less. My Mom had a stroke.   So this scars me.    11. PREGNANCY: "Is there any chance you are pregnant?" "When was your  last menstrual period?"       Not ask due to pain  Protocols used: Dizziness - Lightheadedness-A-AH

## 2021-04-02 NOTE — Progress Notes (Signed)
BP 112/84   Pulse 87   Ht _0  (1.651 m)   Wt 165 lb (74.8 kg)   BMI 27.46 kg/m    Subjective:    Patient ID: Tiffany Mata, female    DOB: Aug 03, 1965, 55 y.o.   MRN: [id number]  HPI: Tiffany Mata is a 55 y.o. female  Chief Complaint  Patient presents with   Dizziness   DIZZINESS Duration:  1 episode that lasted about an hour Description of symptoms: lightheaded Duration of episode: hours Dizziness frequency: no history of the same Provoking factors: none Aggravating factors:  none Triggered by rolling over in bed: no Triggered by bending over: no Aggravated by head movement: no Aggravated by exertion, coughing, loud noises: no Recent head injury: no Recent or current viral symptoms: no History of vasovagal episodes: no Nausea: yes Vomiting: yes Tinnitus: no Hearing loss: no Aural fullness: no Headache:  light headaches Photophobia/phonophobia: no Unsteady gait: yes Postural instability: no Diplopia, dysarthria, dysphagia or weakness: no Related to exertion: no Pallor: no Diaphoresis: no Dyspnea: no Chest pain: no Denies slurred speech.  States she was more tired after the episode.   Patient states she has been sick for a couple of weeks.  States she is no longer congested in her head.  Has a lingering cough that is making it difficult for her to sleep.   Relevant past medical, surgical, family and social history reviewed and updated as indicated. Interim medical history since our last visit reviewed. Allergies and medications reviewed and updated.  Review of Systems  Constitutional:  Positive for fever.  Gastrointestinal:  Positive for nausea and vomiting.  Musculoskeletal:  Positive for gait problem.  Neurological:  Positive for dizziness and headaches. Negative for speech difficulty.   Per HPI unless specifically indicated above     Objective:    BP 112/84   Pulse 87   Ht _1  (1.651 m)   Wt 165 lb (74.8 kg)   BMI 27.46 kg/m   Wt Readings  from Last 3 Encounters:  04/03/21 165 lb (74.8 kg)  01/22/21 165 lb (74.8 kg)  12/26/20 166 lb (75.3 kg)    Physical Exam Vitals and nursing note reviewed.  Constitutional:      General: She is not in acute distress.    Appearance: Normal appearance. She is normal weight. She is not ill-appearing, toxic-appearing or diaphoretic.  HENT:     Head: Normocephalic.     Right Ear: External ear normal. A middle ear effusion is present.     Left Ear: External ear normal. A middle ear effusion is present.     Nose: Nose normal.     Mouth/Throat:     Mouth: Mucous membranes are moist.     Pharynx: Oropharynx is clear.  Eyes:     General:        Right eye: No discharge.        Left eye: No discharge.     Extraocular Movements: Extraocular movements intact.     Conjunctiva/sclera: Conjunctivae normal.     Pupils: Pupils are equal, round, and reactive to light.  Cardiovascular:     Rate and Rhythm: Normal rate and regular rhythm.     Heart sounds: No murmur heard. Pulmonary:     Effort: Pulmonary effort is normal. No respiratory distress.     Breath sounds: Normal breath sounds. No wheezing or rales.  Musculoskeletal:     Cervical back: Normal range of motion and neck supple.  Skin:    General: Skin is warm and dry.     Capillary Refill: Capillary refill takes less than 2 seconds.  Neurological:     General: No focal deficit present.     Mental Status: She is alert and oriented to person, place, and time. Mental status is at baseline.  Psychiatric:        Mood and Affect: Mood normal.        Behavior: Behavior normal.        Thought Content: Thought content normal.        Judgment: Judgment normal.    Results for orders placed or performed in visit on 02/24/21  Urine Culture   Specimen: Urine   UR  Result Value Ref Range   Urine Culture, Routine Final report (A)    Organism ID, Bacteria Escherichia coli (A)    Antimicrobial Susceptibility Comment   Microscopic Examination    Urine  Result Value Ref Range   WBC, UA 0-5 0 - 5 /hpf   RBC 3-10 (A) 0 - 2 /hpf   Epithelial Cells (non renal) 0-10 0 - 10 /hpf   Mucus, UA Present (A) Not Estab.   Bacteria, UA Few (A) None seen/Few  Urinalysis, Routine w reflex microscopic  Result Value Ref Range   Specific Gravity, UA 1.010 1.005 - 1.030   pH, UA 5.0 5.0 - 7.5   Color, UA Yellow Yellow   Appearance Ur Cloudy (A) Clear   Leukocytes,UA 1+ (A) Negative   Protein,UA Negative Negative/Trace   Glucose, UA Negative Negative   Ketones, UA Negative Negative   RBC, UA 2+ (A) Negative   Bilirubin, UA Negative Negative   Urobilinogen, Ur 0.2 0.2 - 1.0 mg/dL   Nitrite, UA Negative Negative   Microscopic Examination See below:       Assessment & Plan:   Problem List Items Addressed This Visit       Other   Dizziness - Primary    Patient experienced one episode 2 days ago.  Has not experienced another one.  Discussed with patient that it could be caused by middle ear effusion.  States she is already taking Zyrtec but it is not helping.  Will draw lab work to evaluate for cause of dizziness.  Discussed that if dizziness happens again Cardiac workup should be obtained to evaluate for abnormal rhythms and can possibly due imaging of the brain.      Relevant Orders   Comp Met (CMET)   CBC w/Diff   TSH   Other Visit Diagnoses     Acute bacterial bronchitis       Has ongoing cough. Will send Tussinex to help with sleep and cough suppression at night. Continue use of Tessalon.  Discussed that cough can last 4-6 weeks.        Follow up plan: Return if symptoms worsen or fail to improve.

## 2021-04-03 ENCOUNTER — Ambulatory Visit (INDEPENDENT_AMBULATORY_CARE_PROVIDER_SITE_OTHER): Payer: Managed Care, Other (non HMO) | Admitting: Nurse Practitioner

## 2021-04-03 ENCOUNTER — Other Ambulatory Visit: Payer: Self-pay

## 2021-04-03 ENCOUNTER — Encounter: Payer: Self-pay | Admitting: Nurse Practitioner

## 2021-04-03 VITALS — BP 112/84 | HR 87 | Ht 65.0 in | Wt 165.0 lb

## 2021-04-03 DIAGNOSIS — J208 Acute bronchitis due to other specified organisms: Secondary | ICD-10-CM

## 2021-04-03 DIAGNOSIS — R42 Dizziness and giddiness: Secondary | ICD-10-CM | POA: Diagnosis not present

## 2021-04-03 DIAGNOSIS — B9689 Other specified bacterial agents as the cause of diseases classified elsewhere: Secondary | ICD-10-CM | POA: Diagnosis not present

## 2021-04-03 MED ORDER — HYDROCOD POLST-CPM POLST ER 10-8 MG/5ML PO SUER: 5.0000 mL | Freq: Two times a day (BID) | ORAL | 0 refills | Status: AC | PRN

## 2021-04-03 NOTE — Assessment & Plan Note (Signed)
Patient experienced one episode 2 days ago.  Has not experienced another one.  Discussed with patient that it could be caused by middle ear effusion.  States she is already taking Zyrtec but it is not helping.  Will draw lab work to evaluate for cause of dizziness.  Discussed that if dizziness happens again Cardiac workup should be obtained to evaluate for abnormal rhythms and can possibly due imaging of the brain.

## 2021-04-04 LAB — COMPREHENSIVE METABOLIC PANEL
ALT: 18 IU/L (ref 0–32)
AST: 18 IU/L (ref 0–40)
Albumin/Globulin Ratio: 1.7 (ref 1.2–2.2)
Albumin: 3.4 g/dL — ABNORMAL LOW (ref 3.8–4.9)
Alkaline Phosphatase: 53 IU/L (ref 44–121)
BUN/Creatinine Ratio: 17 (ref 9–23)
BUN: 15 mg/dL (ref 6–24)
Bilirubin Total: 0.3 mg/dL (ref 0.0–1.2)
CO2: 26 mmol/L (ref 20–29)
Calcium: 8.9 mg/dL (ref 8.7–10.2)
Chloride: 101 mmol/L (ref 96–106)
Creatinine, Ser: 0.89 mg/dL (ref 0.57–1.00)
Globulin, Total: 2 g/dL (ref 1.5–4.5)
Glucose: 86 mg/dL (ref 70–99)
Potassium: 4.2 mmol/L (ref 3.5–5.2)
Sodium: 139 mmol/L (ref 134–144)
Total Protein: 5.4 g/dL — ABNORMAL LOW (ref 6.0–8.5)
eGFR: 76 mL/min/{1.73_m2} (ref >59)

## 2021-04-04 LAB — CBC WITH DIFFERENTIAL/PLATELET
Basophils Absolute: 0 10*3/uL (ref 0.0–0.2)
Basos: 0 %
EOS (ABSOLUTE): 0.1 10*3/uL (ref 0.0–0.4)
Eos: 1 %
Hematocrit: 45 % (ref 34.0–46.6)
Hemoglobin: 15.7 g/dL (ref 11.1–15.9)
Immature Grans (Abs): 0.1 10*3/uL (ref 0.0–0.1)
Immature Granulocytes: 1 %
Lymphocytes Absolute: 2.2 10*3/uL (ref 0.7–3.1)
Lymphs: 20 %
MCH: 31.5 pg (ref 26.6–33.0)
MCHC: 34.9 g/dL (ref 31.5–35.7)
MCV: 90 fL (ref 79–97)
Monocytes Absolute: 1 10*3/uL — ABNORMAL HIGH (ref 0.1–0.9)
Monocytes: 9 %
Neutrophils Absolute: 7.6 10*3/uL — ABNORMAL HIGH (ref 1.4–7.0)
Neutrophils: 69 %
Platelets: 255 10*3/uL (ref 150–450)
RBC: 4.99 x10E6/uL (ref 3.77–5.28)
RDW: 11.7 % (ref 11.7–15.4)
WBC: 11 10*3/uL — ABNORMAL HIGH (ref 3.4–10.8)

## 2021-04-04 LAB — TSH: TSH: 1.73 u[IU]/mL (ref 0.450–4.500)

## 2021-04-06 NOTE — Progress Notes (Signed)
Hi Tiffany Mata.  Your lab work shows that your white blood cell count is slightly elevated.  Likely due to you being sick recently.  The lab work does not explain your symptoms you experienced last week.  Please let me know if you have any questions.

## 2021-04-07 NOTE — Progress Notes (Signed)
04/08/2021 8:38 PM   Tiffany Mata May 24, 1966 [id number]  Referring provider: Jon Billings, NP [address] [place],  [place] [address]  Chief Complaint  Patient presents with   Flank Pain   Urological history: 1. Intermediate risk hematuria -smoker -seen Dr. Yves Dill several years ago for recurrent UTIs and gross hematuria and apparently had negative cystoscopy and imaging -RUS 12/2020 - There is mild RIGHT-sided pelviectasis without caliectasis. No LEFT-sided hydronephrosis. -cysto 12/2020 -no reports of gross heme -UA 3-10 RBC's  HPI: Tiffany Mata is a 55 y.o. female who presents today for right flank pain.  UA 3-10 RBC's  KUB no stones seen  She had an episode of light headedness that caused her to pull her car over that occurred last Wednesday.  She was seen by her PCP and was told she had fluid in her ear and to take Zyrtec.  She then had the sudden onset of right sided flank pain that began over the weekend.    She does not have a history of stones.   Patient denies any modifying or aggravating factors.  Patient denies any gross hematuria, dysuria or suprapubic pain.  Patient denies any fevers, chills, nausea or vomiting.      PMH: Past Medical History:  Diagnosis Date   GERD (gastroesophageal reflux disease)    occ   Stress     Surgical History: Past Surgical History:  Procedure Laterality Date   CESAREAN SECTION     COLONOSCOPY WITH PROPOFOL N/A 09/10/2019   Procedure: COLONOSCOPY WITH PROPOFOL;  Surgeon: Jonathon Bellows, MD;  Location: [facility] ENDOSCOPY;  Service: Gastroenterology;  Laterality: N/A;  Priority 4   DILATION AND CURETTAGE OF UTERUS     EYE SURGERY     FOOT SURGERY     HALLUX VALGUS CORRECTION     KNEE ARTHROSCOPY WITH MEDIAL MENISECTOMY Right 06/13/2018   Procedure: KNEE ARTHROSCOPY WITH MEDIAL MENISECTOMY;  Surgeon: Thornton Park, MD;  Location: [facility] ORS;  Service: Orthopedics;  Laterality: Right;   REFRACTIVE SURGERY     TOE SURGERY      TONSILLECTOMY AND ADENOIDECTOMY      Home Medications:  Allergies as of 04/08/2021   No Known Allergies      Medication List        Accurate as of April 08, 2021 11:59 PM. If you have any questions, ask your nurse or doctor.          albuterol 108 (90 Base) MCG/ACT inhaler Commonly known as: VENTOLIN HFA Inhale 2 puffs into the lungs every 6 (six) hours as needed for wheezing or shortness of breath.   benzonatate 100 MG capsule Commonly known as: TESSALON Take 1 capsule (100 mg total) by mouth 3 (three) times daily as needed.   CALCIUM 600 PO Take 600 mg by mouth daily.   chlorpheniramine-HYDROcodone 10-8 MG/5ML Suer Commonly known as: Tussionex Pennkinetic ER Take 5 mLs by mouth every 12 (twelve) hours as needed for cough.   HAIR/SKIN/NAILS PO Take 1 Dose by mouth.   ibuprofen 800 MG tablet Commonly known as: ADVIL TAKE 1 TABLET(800 MG) BY MOUTH EVERY 8 HOURS AS NEEDED   losartan-hydrochlorothiazide 50-12.5 MG tablet Commonly known as: HYZAAR TAKE 1 TABLET BY MOUTH DAILY   POTASSIUM PO Take 1 tablet by mouth daily.   sertraline 50 MG tablet Commonly known as: ZOLOFT TAKE 1 TABLET(50 MG) BY MOUTH DAILY   sulfamethoxazole-trimethoprim 800-160 MG tablet Commonly known as: BACTRIM DS Take 1 tablet by mouth every  12 (twelve) hours. Started by: Zara Council, PA-C        Allergies: No Known Allergies  Family History: Family History  Problem Relation Age of Onset   Stroke Mother    Hypertension Mother    Cancer Mother        Lung   Diabetes Father     Social History:  reports that she has been smoking cigarettes. She has never used smokeless tobacco. She reports that she does not drink alcohol and does not use drugs.  ROS: Pertinent ROS in HPI  Physical Exam: BP 117/79   Pulse (!) 109   Ht 5\' 8"  (1.727 m)   Wt 168 lb (76.2 kg)   BMI 25.54 kg/m   Constitutional:  Well nourished. Alert and oriented, No acute distress. HEENT: [place] AT,  mask in place.  Trachea midline Cardiovascular: No clubbing, cyanosis, or edema. Respiratory: Normal respiratory effort, no increased work of breathing. Neurologic: Grossly intact, no focal deficits, moving all 4 extremities. Psychiatric: Normal mood and affect.  Laboratory Data: Lab Results  Component Value Date   WBC 11.0 (H) 04/03/2021   HGB 15.7 04/03/2021   HCT 45.0 04/03/2021   MCV 90 04/03/2021   PLT 255 04/03/2021    Lab Results  Component Value Date   CREATININE 0.89 04/03/2021    Lab Results  Component Value Date   TSH 1.730 04/03/2021       Component Value Date/Time   CHOL 206 (H) 09/10/2020 0848   HDL 44 09/10/2020 0848   CHOLHDL 4.7 (H) 09/10/2020 0848   CHOLHDL 5 09/10/2014 1031   VLDL 25.2 09/10/2014 1031   LDLCALC 148 (H) 09/10/2020 0848    Lab Results  Component Value Date   AST 18 04/03/2021   Lab Results  Component Value Date   ALT 18 04/03/2021    Urinalysis Component     Latest Ref Rng & Units 04/08/2021  Specific Gravity, UA     1.005 - 1.030 >1.030 (H)  pH, UA     5.0 - 7.5 5.5  Color, UA     Yellow Yellow  Appearance Ur     Clear Hazy (A)  Leukocytes,UA     Negative Trace (A)  Protein,UA     Negative/Trace Negative  Glucose, UA     Negative Negative  Ketones, UA     Negative Negative  RBC, UA     Negative 2+ (A)  Bilirubin, UA     Negative Negative  Urobilinogen, Ur     0.2 - 1.0 mg/dL 0.2  Nitrite, UA     Negative Negative  Microscopic Examination      See below:   Component     Latest Ref Rng & Units 04/08/2021          WBC, UA     0 - 5 /hpf 6-10 (A)  RBC     0 - 2 /hpf 3-10 (A)  Epithelial Cells (non renal)     0 - 10 /hpf 0-10  Bacteria, UA     None seen/Few Few  I have reviewed the labs.   Pertinent Imaging: CLINICAL DATA:  Kidney stones. RIGHT-sided pain and RIGHT flank pain with vomiting and nausea.   EXAM: ABDOMEN - 1 VIEW   COMPARISON:  CT abdomen dated 01/08/2018.   FINDINGS: No  evidence of renal or ureteral calculi. Multiple calcifications within the lower pelvis are compatible with vascular phleboliths demonstrated on earlier CT.   Bowel gas pattern is nonobstructive.  No evidence of free intraperitoneal air. No evidence of soft tissue mass or abnormal fluid collection. Visualized osseous structures are unremarkable.   IMPRESSION: 1. Negative. No evidence of renal or ureteral calculi. 2. Nonobstructive bowel gas pattern.     Electronically Signed   By: Franki Cabot M.D.   On: 04/12/2021 10:10  I have independently reviewed the films.    Assessment & Plan:    1. Right flank pain -patient with the sudden onset of right flank pain with micro heme -suspicious for ureteral stone -CT renal stone is pending  2. Microscopic hematuria -UA 3-10 RBC's -urine sent for culture -will start Septra DS in preparation of possible ureteroscopy in the future if CT demonstrates a stone  -CT renal stone study is pending  Return for pending CT and urine culture results .  These notes generated with voice recognition software. I apologize for typographical errors.  Zara Council, PA-C  [facility] [address]  [place], [place] [address] [phone]

## 2021-04-08 ENCOUNTER — Encounter: Payer: Self-pay | Admitting: Urology

## 2021-04-08 ENCOUNTER — Ambulatory Visit (INDEPENDENT_AMBULATORY_CARE_PROVIDER_SITE_OTHER): Payer: Managed Care, Other (non HMO) | Admitting: Urology

## 2021-04-08 ENCOUNTER — Ambulatory Visit
Admission: RE | Admit: 2021-04-08 | Discharge: 2021-04-08 | Disposition: A | Discharge status: Home / Self Care | Payer: Managed Care, Other (non HMO) | Attending: Urology | Admitting: Urology

## 2021-04-08 ENCOUNTER — Ambulatory Visit
Admission: RE | Admit: 2021-04-08 | Discharge: 2021-04-08 | Disposition: A | Discharge status: Home / Self Care | Payer: Managed Care, Other (non HMO) | Source: Ambulatory Visit | Attending: Urology | Admitting: Urology

## 2021-04-08 ENCOUNTER — Other Ambulatory Visit: Payer: Self-pay

## 2021-04-08 VITALS — BP 117/79 | HR 109 | Ht 68.0 in | Wt 168.0 lb

## 2021-04-08 DIAGNOSIS — R3129 Other microscopic hematuria: Secondary | ICD-10-CM | POA: Diagnosis not present

## 2021-04-08 DIAGNOSIS — N200 Calculus of kidney: Secondary | ICD-10-CM | POA: Diagnosis not present

## 2021-04-08 DIAGNOSIS — R109 Unspecified abdominal pain: Secondary | ICD-10-CM

## 2021-04-08 MED ORDER — SULFAMETHOXAZOLE-TRIMETHOPRIM 800-160 MG PO TABS: 1.0000 | ORAL_TABLET | Freq: Two times a day (BID) | ORAL | 0 refills | Status: DC

## 2021-04-08 NOTE — Patient Instructions (Signed)
CTscan scheduled for tomorrow 04/09/21@9 :15am at the [facility] [address], [place], [place] [address] We will call you with results

## 2021-04-09 ENCOUNTER — Ambulatory Visit
Admission: RE | Admit: 2021-04-09 | Discharge: 2021-04-09 | Disposition: A | Discharge status: Home / Self Care | Payer: Managed Care, Other (non HMO) | Source: Ambulatory Visit | Attending: Urology | Admitting: Urology

## 2021-04-09 DIAGNOSIS — R109 Unspecified abdominal pain: Secondary | ICD-10-CM | POA: Insufficient documentation

## 2021-04-14 ENCOUNTER — Encounter: Payer: Self-pay | Admitting: *Deleted

## 2021-04-14 LAB — CULTURE, URINE COMPREHENSIVE

## 2021-04-15 LAB — URINALYSIS, COMPLETE
Bilirubin, UA: NEGATIVE
Glucose, UA: NEGATIVE
Ketones, UA: NEGATIVE
Nitrite, UA: NEGATIVE
Protein,UA: NEGATIVE
Specific Gravity, UA: >1.03 — ABNORMAL HIGH (ref 1.005–1.030)
Urobilinogen, Ur: 0.2 mg/dL (ref 0.2–1.0)
pH, UA: 5.5 (ref 5.0–7.5)

## 2021-04-15 LAB — MICROSCOPIC EXAMINATION

## 2021-04-20 ENCOUNTER — Other Ambulatory Visit: Payer: Self-pay | Admitting: Urology

## 2021-04-20 ENCOUNTER — Telehealth: Payer: Self-pay | Admitting: Nurse Practitioner

## 2021-04-20 DIAGNOSIS — R3129 Other microscopic hematuria: Secondary | ICD-10-CM

## 2021-04-20 NOTE — Telephone Encounter (Signed)
Copied from [place] [id number]. Topic: Complaint - Provider (sensitive) >> Apr 20, 2021  9:41 AM Bayard Beaver wrote: Date of Incident: 04/03/21 Details of complaint: state Dr Mathis Dad acting unprofessional and uncaring towards her about her illness How would the patient like to see it resolved? She wants her to listen more and show empathy and concern towards patients. On a scale of 1-10, how was your experience? 1 What would it take to bring it to a 10? She wants Dr to be talked to of how Dr Mathis Dad demeanor was toward her  Route to Engineer, building services.

## 2021-04-20 NOTE — Telephone Encounter (Deleted)
Copied from [place] [phone]. Topic: Complaint - Provider (sensitive) >> Apr 20, 2021  9:41 AM Bayard Beaver wrote: Date of Incident: 04/03/21 Details of complaint: state Dr Mathis Dad acting unprofessional and uncaring towards her about her illness How would the patient like to see it resolved? She wants her to listen more and show empathy and concern towards patients. On a scale of 1-10, how was your experience? 1 What would it take to bring it to a 10? She wants Dr to be talked to of how Dr Mathis Dad demeanor was toward her  Route to Engineer, building services.

## 2021-04-20 NOTE — Telephone Encounter (Signed)
Please see patient concern below.   Copied from [place] [phone]. Topic: Complaint - Provider (sensitive) >> Apr 20, 2021  9:41 AM Bayard Beaver wrote: Date of Incident: 04/03/21 Details of complaint: state Dr Mathis Dad acting unprofessional and uncaring towards her about her illness How would the patient like to see it resolved? She wants her to listen more and show empathy and concern towards patients. On a scale of 1-10, how was your experience? 1 What would it take to bring it to a 10? She wants Dr to be talked to of how Dr Mathis Dad demeanor was toward her  Route to Engineer, building services.

## 2021-05-05 NOTE — Telephone Encounter (Signed)
Tiffany Mata,  Please see me regarding this patient's concerns.  Thanks

## 2021-06-11 ENCOUNTER — Ambulatory Visit: Payer: Managed Care, Other (non HMO) | Admitting: Internal Medicine

## 2021-06-13 ENCOUNTER — Other Ambulatory Visit: Payer: Self-pay | Admitting: Nurse Practitioner

## 2021-06-14 NOTE — Telephone Encounter (Signed)
Requested Prescriptions  Pending Prescriptions Disp Refills   losartan-hydrochlorothiazide (HYZAAR) 50-12.5 MG tablet [Pharmacy Med Name: LOSARTAN/HCTZ 50/12.5MG  TABLETS] 90 tablet 0    Sig: TAKE 1 TABLET BY MOUTH DAILY     Cardiovascular: ARB + Diuretic Combos Passed - 06/13/2021  8:09 AM      Passed - K in normal range and within 180 days    Potassium  Date Value Ref Range Status  04/03/2021 4.2 3.5 - 5.2 mmol/L Final         Passed - Na in normal range and within 180 days    Sodium  Date Value Ref Range Status  04/03/2021 139 134 - 144 mmol/L Final         Passed - Cr in normal range and within 180 days    Creatinine, Ser  Date Value Ref Range Status  04/03/2021 0.89 0.57 - 1.00 mg/dL Final         Passed - Ca in normal range and within 180 days    Calcium  Date Value Ref Range Status  04/03/2021 8.9 8.7 - 10.2 mg/dL Final         Passed - Patient is not pregnant      Passed - Last BP in normal range    BP Readings from Last 1 Encounters:  04/08/21 117/79         Passed - Valid encounter within last 6 months    Recent Outpatient Visits          2 months ago Dizziness   [facility] Jon Billings, NP   3 months ago Acute cystitis with hematuria   [facility] [place], Megan P, DO   5 months ago Asymptomatic microscopic hematuria   [facility] McElwee, Lauren A, NP   8 months ago Adjustment disorder with mixed anxiety and depressed mood   [facility] Jon Billings, NP   9 months ago Annual physical exam   [facility] Jon Billings, NP      Future Appointments            In 1 month Ronnald Ramp, Iven Finn, MD [facility] [place]   In 1 month [place], Ronda Fairly, [facility]

## 2021-07-20 ENCOUNTER — Other Ambulatory Visit: Payer: Self-pay | Admitting: Infectious Diseases

## 2021-07-20 DIAGNOSIS — Z1231 Encounter for screening mammogram for malignant neoplasm of breast: Secondary | ICD-10-CM

## 2021-07-27 ENCOUNTER — Ambulatory Visit: Payer: Self-pay | Admitting: Urology

## 2021-07-30 ENCOUNTER — Ambulatory Visit: Payer: Managed Care, Other (non HMO) | Admitting: Family Medicine

## 2021-07-31 ENCOUNTER — Ambulatory Visit: Payer: Self-pay | Admitting: Urology

## 2021-08-04 ENCOUNTER — Other Ambulatory Visit: Payer: Self-pay

## 2021-08-04 ENCOUNTER — Ambulatory Visit
Admission: RE | Admit: 2021-08-04 | Discharge: 2021-08-04 | Disposition: A | Discharge status: Home / Self Care | Payer: Managed Care, Other (non HMO) | Source: Ambulatory Visit | Attending: Infectious Diseases | Admitting: Infectious Diseases

## 2021-08-04 DIAGNOSIS — Z1231 Encounter for screening mammogram for malignant neoplasm of breast: Secondary | ICD-10-CM | POA: Insufficient documentation

## 2021-08-10 ENCOUNTER — Other Ambulatory Visit: Payer: Self-pay | Admitting: *Deleted

## 2021-08-10 DIAGNOSIS — Z87891 Personal history of nicotine dependence: Secondary | ICD-10-CM

## 2021-08-11 ENCOUNTER — Other Ambulatory Visit: Payer: Self-pay | Admitting: Infectious Diseases

## 2021-08-11 DIAGNOSIS — R928 Other abnormal and inconclusive findings on diagnostic imaging of breast: Secondary | ICD-10-CM

## 2021-08-17 ENCOUNTER — Ambulatory Visit
Admission: RE | Admit: 2021-08-17 | Discharge: 2021-08-17 | Disposition: A | Discharge status: Home / Self Care | Payer: Managed Care, Other (non HMO) | Source: Ambulatory Visit | Attending: Acute Care | Admitting: Acute Care

## 2021-08-17 ENCOUNTER — Ambulatory Visit (INDEPENDENT_AMBULATORY_CARE_PROVIDER_SITE_OTHER): Payer: Managed Care, Other (non HMO) | Admitting: Acute Care

## 2021-08-17 ENCOUNTER — Encounter: Payer: Self-pay | Admitting: Acute Care

## 2021-08-17 DIAGNOSIS — Z87891 Personal history of nicotine dependence: Secondary | ICD-10-CM

## 2021-08-17 NOTE — Patient Instructions (Signed)
Thank you for participating in the [facility] Cancer Screening Program. °It was our pleasure to meet you today. °We will call you with the results of your scan within the next few days. °Your scan will be assigned a Lung RADS category score by the physicians reading the scans.  °This Lung RADS score determines follow up scanning.  °See below for description of categories, and follow up screening recommendations. °We will be in touch to schedule your follow up screening annually or based on recommendations of our providers. °We will fax a copy of your scan results to your Primary Care Physician, or the physician who referred you to the program, to ensure they have the results. °Please call the office if you have any questions or concerns regarding your scanning experience or results.  °Our office number is [phone]. °Please speak with Denise Phelps, RN. She is our Lung Cancer Screening RN. °If she is unavailable when you call, please have the office staff send her a message. She will return your call at her earliest convenience. °Remember, if your scan is normal, we will scan you annually as long as you continue to meet the criteria for the program. (Age 55-77, Current smoker or smoker who has quit within the last 15 years). °If you are a smoker, remember, quitting is the single most powerful action that you can take to decrease your risk of lung cancer and other pulmonary, breathing related problems. °We know quitting is hard, and we are here to help.  °Please let us know if there is anything we can do to help you meet your goal of quitting. °If you are a former smoker, congratulations. We are proud of you! Remain smoke free! °Remember you can refer friends or family members through the number above.  °We will screen them to make sure they meet criteria for the program. °Thank you for helping us take better care of you by participating in Lung Screening. ° °You can receive free nicotine replacement therapy  ( patches, gum or mints) by calling 1-800-QUIT NOW. Please call so we can get you on the path to becoming  a non-smoker. I know it is hard, but you can do this! ° °Lung RADS Categories: ° °Lung RADS 1: no nodules or definitely non-concerning nodules.  °Recommendation is for a repeat annual scan in 12 months. ° °Lung RADS 2:  nodules that are non-concerning in appearance and behavior with a very low likelihood of becoming an active cancer. °Recommendation is for a repeat annual scan in 12 months. ° °Lung RADS 3: nodules that are probably non-concerning , includes nodules with a low likelihood of becoming an active cancer.  Recommendation is for a 6-month repeat screening scan. Often noted after an upper respiratory illness. We will be in touch to make sure you have no questions, and to schedule your 6-month scan. ° °Lung RADS 4 A: nodules with concerning findings, recommendation is most often for a follow up scan in 3 months or additional testing based on our provider's assessment of the scan. We will be in touch to make sure you have no questions and to schedule the recommended 3 month follow up scan. ° °Lung RADS 4 B:  indicates findings that are concerning. We will be in touch with you to schedule additional diagnostic testing based on our provider's  assessment of the scan. ° °Hypnosis for smoking cessation  °[employer] °[phone] ° °Acupuncture for smoking cessation  °[facility] °[phone]  °

## 2021-08-17 NOTE — Progress Notes (Signed)
Virtual Visit via Telephone Note  I connected with Tiffany Mata on 05/12/21 at  2:00 PM EST by telephone and verified that I am speaking with the correct person using two identifiers.  Location: Patient: Home Provider: Working from home   I discussed the limitations, risks, security and privacy concerns of performing an evaluation and management service by telephone and the availability of in person appointments. I also discussed with the patient that there may be a patient responsible charge related to this service. The patient expressed understanding and agreed to proceed.  Shared Decision Making Visit Lung Cancer Screening Program [phone])   Eligibility: Age 56 y.o. Pack Years Smoking History Calculation 20 (# packs/per year x # years smoked) Recent History of coughing up blood  no Unexplained weight loss? no ( >Than 15 pounds within the last 6 months ) Prior History Lung / other cancer no (Diagnosis within the last 5 years already requiring surveillance chest CT Scans). Smoking Status Former Smoker Former Smokers: Years since quit: < 1 year  Quit Date: 05/2021  Visit Components: Discussion included one or more decision making aids. yes Discussion included risk/benefits of screening. yes Discussion included potential follow up diagnostic testing for abnormal scans. yes Discussion included meaning and risk of over diagnosis. yes Discussion included meaning and risk of False Positives. yes Discussion included meaning of total radiation exposure. yes  Counseling Included: Importance of adherence to annual lung cancer LDCT screening. yes Impact of comorbidities on ability to participate in the program. yes Ability and willingness to under diagnostic treatment. yes  Smoking Cessation Counseling: Current Smokers:  Discussed importance of smoking cessation. yes Information about tobacco cessation classes and interventions provided to patient. yes Patient provided with "ticket"  for LDCT Scan. yes Symptomatic Patient. no  Counseling NA Diagnosis Code: Tobacco Use Z72.0 Asymptomatic Patient yes  Counseling (Intermediate counseling: > three minutes counseling) B0175 Former Smokers:  Discussed the importance of maintaining cigarette abstinence. yes Diagnosis Code: Personal History of Nicotine Dependence. Z02.585 Information about tobacco cessation classes and interventions provided to patient. Yes Patient provided with "ticket" for LDCT Scan. yes Written Order for Lung Cancer Screening with LDCT placed in Epic. Yes (CT Chest Lung Cancer Screening Low Dose W/O CM) [id number] Z12.2-Screening of respiratory organs Z87.891-Personal history of nicotine dependence   I spent 25 minutes of face to face time with her discussing the risks and benefits of lung cancer screening. We viewed a power point together that explained in detail the above noted topics. We took the time to pause the power point at intervals to allow for questions to be asked and answered to ensure understanding. We discussed that she had taken the single most powerful action possible to decrease her risk of developing lung cancer when he quit smoking. I counseled her to remain smoke free, and to contact me if she ever had the desire to smoke again so that I can provide resources and tools to help support the effort to remain smoke free. We discussed the time and location of the scan, and that either  Doroteo Glassman RN or I will call with the results within  24-48 hours of receiving them. She has my card and contact information in the event she needs to speak with me, in addition to a copy of the power point we reviewed as a resource. She verbalized understanding of all of the above and had no further questions upon leaving the office.     I explained to the patient that  there has been a high incidence of coronary artery disease noted on these exams. I explained that this is a non-gated exam therefore degree or  severity cannot be determined. This patient is not on statin therapy. I have asked the patient to follow-up with their PCP regarding any incidental finding of coronary artery disease and management with diet or medication as they feel is clinically indicated. The patient verbalized understanding of the above and had no further questions.   I spent 3 minutes counseling on smoking cessation and the health risks of continued tobacco abuse   Delmas Faucett D. Kenton Kingfisher, NP-C [facility] Personal contact information can be found on Amion  08/17/2021, 8:13 AM

## 2021-08-18 ENCOUNTER — Ambulatory Visit
Admission: RE | Admit: 2021-08-18 | Discharge: 2021-08-18 | Disposition: A | Discharge status: Home / Self Care | Payer: Managed Care, Other (non HMO) | Source: Ambulatory Visit | Attending: Infectious Diseases | Admitting: Infectious Diseases

## 2021-08-18 ENCOUNTER — Other Ambulatory Visit: Payer: Self-pay

## 2021-08-18 DIAGNOSIS — R928 Other abnormal and inconclusive findings on diagnostic imaging of breast: Secondary | ICD-10-CM | POA: Diagnosis not present

## 2021-08-20 ENCOUNTER — Telehealth: Payer: Self-pay | Admitting: Acute Care

## 2021-08-20 ENCOUNTER — Other Ambulatory Visit: Payer: Self-pay

## 2021-08-20 DIAGNOSIS — Z87891 Personal history of nicotine dependence: Secondary | ICD-10-CM

## 2021-08-20 NOTE — Telephone Encounter (Signed)
Contacted patient to review LDCT results.  Atherosclerosis and mild emphysema noted.  Patient on statin medication.  Also mentioned was calcification of the mitral annulus. Patient has no previous knowledge of mitral concerns and does not see cardiologist.  Results to be faxed to PCP with notation of possible need for echocardiogram.  Patient states she is now followed by Leonel Ramsay , MD at [facility] and requests results to go to him.  Results faxed with notation.  Patient verbalized understanding and had no further questions.

## 2021-12-17 ENCOUNTER — Telehealth: Payer: Self-pay | Admitting: Urology

## 2021-12-17 NOTE — Telephone Encounter (Signed)
Pt booked for appt 01/14/22.

## 2021-12-17 NOTE — Telephone Encounter (Signed)
It will be a year in July since Dr. Bernardo Heater looked into her bladder, so we need to see her in clinic to check her urine and follow up with her.

## 2021-12-21 ENCOUNTER — Other Ambulatory Visit: Payer: Self-pay | Admitting: Internal Medicine

## 2021-12-21 NOTE — Telephone Encounter (Signed)
Called pt, advised her she is due for appt for medication refill. She said she already hs another PCP and will get them to refill it for her. No further assistance needed.

## 2022-01-14 ENCOUNTER — Ambulatory Visit (INDEPENDENT_AMBULATORY_CARE_PROVIDER_SITE_OTHER): Payer: Managed Care, Other (non HMO) | Admitting: Urology

## 2022-01-14 ENCOUNTER — Encounter: Payer: Self-pay | Admitting: Urology

## 2022-01-14 VITALS — BP 119/80 | HR 93 | Ht 69.0 in | Wt 185.0 lb

## 2022-01-14 DIAGNOSIS — R3129 Other microscopic hematuria: Secondary | ICD-10-CM | POA: Diagnosis not present

## 2022-01-14 NOTE — Progress Notes (Signed)
01/14/22 3:35 PM   Tiffany Mata 03/09/1966 [id number]  Referring provider:  Charlynne Cousins, MD [address] [place],  [place] [address]  Chief Complaint  Patient presents with   Follow-up    Urological history  1. Intermediate risk hematuria -smoker -seen Dr. Yves Dill several years ago for recurrent UTIs and gross hematuria and apparently had negative cystoscopy and imaging -RUS 12/2020 - There is mild RIGHT-sided pelviectasis without caliectasis. No LEFT-sided hydronephrosis. -cysto 12/2020 - CT renal stone study 04/09/2021 visualized No renal, ureteral or bladder calculi or mass. There is a stable simple appearing lower pole left renal cyst. - KUB on 04/09/2021 visualized no evidence of renal or ureteral calculi.  - UA benign today    HPI: Tiffany Mata is a 56 y.o.female for a 1 year follow-up.   She reports today that she had quite smoking about 6-8 months ago. She reports some irritability since stopping smoking. She denies any kidney stone episodes.  Patient denies any modifying or aggravating factors.  Patient denies any gross hematuria, dysuria or suprapubic/flank pain.  Patient denies any fevers, chills, nausea or vomiting.   UA benign today   PMH: Past Medical History:  Diagnosis Date   GERD (gastroesophageal reflux disease)    occ   Stress     Surgical History: Past Surgical History:  Procedure Laterality Date   CESAREAN SECTION     COLONOSCOPY WITH PROPOFOL N/A 09/10/2019   Procedure: COLONOSCOPY WITH PROPOFOL;  Surgeon: Jonathon Bellows, MD;  Location: [facility] ENDOSCOPY;  Service: Gastroenterology;  Laterality: N/A;  Priority 4   DILATION AND CURETTAGE OF UTERUS     EYE SURGERY     FOOT SURGERY     HALLUX VALGUS CORRECTION     KNEE ARTHROSCOPY WITH MEDIAL MENISECTOMY Right 06/13/2018   Procedure: KNEE ARTHROSCOPY WITH MEDIAL MENISECTOMY;  Surgeon: Thornton Park, MD;  Location: [facility] ORS;  Service: Orthopedics;  Laterality: Right;   REFRACTIVE SURGERY     TOE  SURGERY     TONSILLECTOMY AND ADENOIDECTOMY      Home Medications:  Allergies as of 01/14/2022   No Known Allergies      Medication List        Accurate as of January 14, 2022  3:35 PM. If you have any questions, ask your nurse or doctor.          STOP taking these medications    sulfamethoxazole-trimethoprim 800-160 MG tablet Commonly known as: BACTRIM DS       TAKE these medications    albuterol 108 (90 Base) MCG/ACT inhaler Commonly known as: VENTOLIN HFA Inhale 2 puffs into the lungs every 6 (six) hours as needed for wheezing or shortness of breath.   benzonatate 100 MG capsule Commonly known as: TESSALON Take 1 capsule (100 mg total) by mouth 3 (three) times daily as needed.   CALCIUM 600 PO Take 600 mg by mouth daily.   chlorpheniramine-HYDROcodone 10-8 MG/5ML Suer Commonly known as: Tussionex Pennkinetic ER Take 5 mLs by mouth every 12 (twelve) hours as needed for cough.   HAIR/SKIN/NAILS PO Take 1 Dose by mouth.   ibuprofen 800 MG tablet Commonly known as: ADVIL TAKE 1 TABLET(800 MG) BY MOUTH EVERY 8 HOURS AS NEEDED   losartan-hydrochlorothiazide 50-12.5 MG tablet Commonly known as: HYZAAR TAKE 1 TABLET BY MOUTH DAILY   POTASSIUM PO Take 1 tablet by mouth daily.   sertraline 50 MG tablet Commonly known as: ZOLOFT TAKE 1 TABLET(50 MG) BY MOUTH DAILY  Allergies:  No Known Allergies  Family History: Family History  Problem Relation Age of Onset   Stroke Mother    Hypertension Mother    Cancer Mother        Lung   Diabetes Father     Social History:  reports that she quit smoking about 7 months ago. Her smoking use included cigarettes. She has a 21.00 pack-year smoking history. She has never used smokeless tobacco. She reports that she does not drink alcohol and does not use drugs.   Physical Exam: BP 119/80   Pulse 93   Ht '5\' 9"'  (1.753 m)   Wt 185 lb (83.9 kg)   BMI 27.32 kg/m   Constitutional:  Alert and oriented, No  acute distress. HEENT: [place] AT, moist mucus membranes.  Trachea midline Cardiovascular: No clubbing, cyanosis, or edema. Respiratory: Normal respiratory effort, no increased work of breathing. Neurologic: Grossly intact, no focal deficits, moving all 4 extremities. Psychiatric: Normal mood and affect.  Laboratory Data:  Ref Range & Units 2 mo ago  Cholesterol, Total 100 - 200 mg/dL 148   Triglyceride 35 - 199 mg/dL 163   HDL (High Density Lipoprotein) Cholesterol 35.0 - 85.0 mg/dL 42.2   LDL Calculated 0 - 130 mg/dL 73   VLDL Cholesterol mg/dL 33   Cholesterol/HDL Ratio  3.5   Resulting Agency  [facility]    Ref Range & Units 2 mo ago  Thyroid Stimulating Hormone (TSH) 0.450-5.330 uIU/ml uIU/mL 3.123   Comment: Reference Range for Pregnant Females >= 48 yrs old:  Normal Range for 1st trimester: 0.05-3.70 ulU/ml  Normal Range for 2nd trimester: 0.31-4.35 ulU/ml  Resulting [facility]    Ref Range & Units 2 mo ago  WBC (White Blood Cell Count) 4.1 - 10.2 10^3/uL 7.6   RBC (Red Blood Cell Count) 4.04 - 5.48 10^6/uL 4.82   Hemoglobin 12.0 - 15.0 gm/dL 15.0   Hematocrit 35.0 - 47.0 % 43.5   MCV (Mean Corpuscular Volume) 80.0 - 100.0 fl 90.2   MCH (Mean Corpuscular Hemoglobin) 27.0 - 31.2 pg 31.1   MCHC (Mean Corpuscular Hemoglobin Concentration) 32.0 - 36.0 gm/dL 34.5   Platelet Count 150 - 450 10^3/uL 216   RDW-CV (Red Cell Distribution Width) 11.6 - 14.8 % 11.9   MPV (Mean Platelet Volume) 9.4 - 12.4 fl 10.5   Neutrophils 1.50 - 7.80 10^3/uL 4.71   Lymphocytes 1.00 - 3.60 10^3/uL 1.93   Monocytes 0.00 - 1.50 10^3/uL 0.79   Eosinophils 0.00 - 0.55 10^3/uL 0.08   Basophils 0.00 - 0.09 10^3/uL 0.03   Neutrophil % 32.0 - 70.0 % 62.3   Lymphocyte % 10.0 - 50.0 % 25.5   Monocyte % 4.0 - 13.0 % 10.4   Eosinophil % 1.0 - 5.0 % 1.1   Basophil% 0.0 - 2.0 % 0.4   Immature Granulocyte % <=0.7 % 0.3   Immature Granulocyte Count <=0.06 10^3/L 0.02    Resulting Agency  [facility]    Ref Range & Units 1 mo ago  Glucose 70 - 110 mg/dL 106   Sodium 136 - 145 mmol/L 139   Potassium 3.6 - 5.1 mmol/L 4.2   Chloride 97 - 109 mmol/L 104   Carbon Dioxide (CO2) 22.0 - 32.0 mmol/L 30.5   Urea Nitrogen (BUN) 7 - 25 mg/dL 19   Creatinine 0.6 - 1.1 mg/dL 0.9   Glomerular Filtration Rate (eGFR), MDRD Estimate >60 mL/min/1.73sq m  65   Calcium 8.7 - 10.3 mg/dL 8.9   AST  8 - 39 U/L 23   ALT  5 - 38 U/L 29   Alk Phos (alkaline Phosphatase) 34 - 104 U/L 56   Albumin 3.5 - 4.8 g/dL 3.7   Bilirubin, Total 0.3 - 1.2 mg/dL 0.5   Protein, Total 6.1 - 7.9 g/dL 5.5 Low    A/G Ratio 1.0 - 5.0 gm/dL 2.1   Resulting Agency  [facility] - [facility]   Urinalysis Benign I have reviewed the labs.   Assessment & Plan:    1. Intermediate risk hematuria -work up in 2022 - NED -no reports of gross heme - UA benign  - She denies any kidney stone episodes.  - Encourage her to continue cessation from smoking   Return in about 1 year (around 01/15/2023) for UA and symptom recheck .  [facility] [address] [place], [place] [address] [phone]  I, Kirke Shaggy Littlejohn,acting as a scribe for [facility], PA-C.,have documented all relevant documentation on the behalf of Tiffany Malson, PA-C,as directed by  [facility], PA-C while in the presence of [place], PA-C.  I have reviewed the above documentation for accuracy and completeness, and I agree with the above.    Zara Council, PA-C

## 2022-01-15 LAB — MICROSCOPIC EXAMINATION: Bacteria, UA: NONE SEEN

## 2022-01-15 LAB — URINALYSIS, COMPLETE
Bilirubin, UA: NEGATIVE
Glucose, UA: NEGATIVE
Ketones, UA: NEGATIVE
Leukocytes,UA: NEGATIVE
Nitrite, UA: NEGATIVE
Protein,UA: NEGATIVE
Specific Gravity, UA: 1.02 (ref 1.005–1.030)
Urobilinogen, Ur: 0.2 mg/dL (ref 0.2–1.0)
pH, UA: 5.5 (ref 5.0–7.5)

## 2022-07-22 ENCOUNTER — Other Ambulatory Visit: Payer: Self-pay | Admitting: Infectious Diseases

## 2022-07-22 DIAGNOSIS — Z1231 Encounter for screening mammogram for malignant neoplasm of breast: Secondary | ICD-10-CM

## 2022-08-16 ENCOUNTER — Encounter: Payer: Self-pay | Admitting: Acute Care

## 2022-08-17 ENCOUNTER — Ambulatory Visit: Payer: Managed Care, Other (non HMO)

## 2022-08-20 ENCOUNTER — Ambulatory Visit
Admission: RE | Admit: 2022-08-20 | Discharge: 2022-08-20 | Disposition: A | Discharge status: Home / Self Care | Payer: Managed Care, Other (non HMO) | Source: Ambulatory Visit | Attending: Acute Care | Admitting: Acute Care

## 2022-08-20 DIAGNOSIS — Z87891 Personal history of nicotine dependence: Secondary | ICD-10-CM

## 2022-08-23 ENCOUNTER — Other Ambulatory Visit: Payer: Self-pay | Admitting: Acute Care

## 2022-08-23 DIAGNOSIS — Z87891 Personal history of nicotine dependence: Secondary | ICD-10-CM

## 2022-08-23 DIAGNOSIS — Z122 Encounter for screening for malignant neoplasm of respiratory organs: Secondary | ICD-10-CM

## 2022-11-12 ENCOUNTER — Ambulatory Visit
Admission: RE | Admit: 2022-11-12 | Discharge: 2022-11-12 | Disposition: A | Discharge status: Home / Self Care | Payer: Managed Care, Other (non HMO) | Source: Ambulatory Visit | Attending: Infectious Diseases | Admitting: Infectious Diseases

## 2022-11-12 DIAGNOSIS — Z1231 Encounter for screening mammogram for malignant neoplasm of breast: Secondary | ICD-10-CM | POA: Insufficient documentation

## 2023-01-13 NOTE — Progress Notes (Deleted)
01/13/23 12:26 PM   Tiffany Mata 12/08/1965 [id number]  Referring provider:  Loura Pardon, MD [address] [place],  [place] [address]  Urological history  1. Intermediate risk hematuria -smoker -seen Dr. Evelene Croon several years ago for recurrent UTIs and gross hematuria and apparently had negative cystoscopy and imaging -RUS 12/2020 - There is mild RIGHT-sided pelviectasis without caliectasis. No LEFT-sided hydronephrosis. -cysto 12/2020 - CT renal stone study 04/09/2021 visualized No renal, ureteral or bladder calculi or mass. There is a stable simple appearing lower pole left renal cyst. - KUB on 04/09/2021 visualized no evidence of renal or ureteral calculi.  No chief complaint on file.     HPI: Tiffany Mata is a 57 y.o.female for a 1 year follow-up.   Previous records reviewed.   UA w/ 19 RBC's at PCP in January 2024  UA ***  PMH: Past Medical History:  Diagnosis Date   GERD (gastroesophageal reflux disease)    occ   Stress     Surgical History: Past Surgical History:  Procedure Laterality Date   CESAREAN SECTION     COLONOSCOPY WITH PROPOFOL N/A 09/10/2019   Procedure: COLONOSCOPY WITH PROPOFOL;  Surgeon: Wyline Mood, MD;  Location[facility] ENDOSCOPY;  Service: Gastroenterology;  Laterality: N/A;  Priority 4   DILATION AND CURETTAGE OF UTERUS     EYE SURGERY     FOOT SURGERY     HALLUX VALGUS CORRECTION     KNEE ARTHROSCOPY WITH MEDIAL MENISECTOMY Right 06/13/2018   Procedure: KNEE ARTHROSCOPY WITH MEDIAL MENISECTOMY;  Surgeon: Juanell Fairly, MD;  Location: [facility] ORS;  Service: Orthopedics;  Laterality: Right;   REFRACTIVE SURGERY     TOE SURGERY     TONSILLECTOMY AND ADENOIDECTOMY      Home Medications:  Allergies as of 01/14/2023   No Known Allergies      Medication List        Accurate as of January 13, 2023 12:26 PM. If you have any questions, ask your nurse or doctor.          albuterol 108 (90 Base) MCG/ACT inhaler Commonly known as:  VENTOLIN HFA Inhale 2 puffs into the lungs every 6 (six) hours as needed for wheezing or shortness of breath.   benzonatate 100 MG capsule Commonly known as: TESSALON Take 1 capsule (100 mg total) by mouth 3 (three) times daily as needed.   CALCIUM 600 PO Take 600 mg by mouth daily.   chlorpheniramine-HYDROcodone 10-8 MG/5ML Suer Commonly known as: Tussionex Pennkinetic ER Take 5 mLs by mouth every 12 (twelve) hours as needed for cough.   HAIR/SKIN/NAILS PO Take 1 Dose by mouth.   ibuprofen 800 MG tablet Commonly known as: ADVIL TAKE 1 TABLET(800 MG) BY MOUTH EVERY 8 HOURS AS NEEDED   losartan-hydrochlorothiazide 50-12.5 MG tablet Commonly known as: HYZAAR TAKE 1 TABLET BY MOUTH DAILY   POTASSIUM PO Take 1 tablet by mouth daily.   sertraline 50 MG tablet Commonly known as: ZOLOFT TAKE 1 TABLET(50 MG) BY MOUTH DAILY        Allergies:  No Known Allergies  Family History: Family History  Problem Relation Age of Onset   Stroke Mother    Hypertension Mother    Cancer Mother        Lung   Diabetes Father    Breast cancer Neg Hx     Social History:  reports that she quit smoking about 19 months ago. Her smoking use included cigarettes. She started smoking about 36 years ago.  She has a 21 pack-year smoking history. She has never used smokeless tobacco. She reports that she does not drink alcohol and does not use drugs.   Physical Exam: There were no vitals taken for this visit.  Constitutional:  Well nourished. Alert and oriented, No acute distress. HEENT: [place] AT, moist mucus membranes.  Trachea midline, no masses. Cardiovascular: No clubbing, cyanosis, or edema. Respiratory: Normal respiratory effort, no increased work of breathing. GU: No CVA tenderness.  No bladder fullness or masses. Vulvovaginal atrophy w/ pallor, loss of rugae, introital retraction, excoriations.  Vulvar thinning, fusion of labia, clitoral hood retraction, prominent urethral meatus.   ***  external genitalia, *** pubic hair distribution, no lesions.  Normal urethral meatus, no lesions, no prolapse, no discharge.   No urethral masses, tenderness and/or tenderness. No bladder fullness, tenderness or masses. *** vagina mucosa, *** estrogen effect, no discharge, no lesions, *** pelvic support, *** cystocele and *** rectocele noted.  No cervical motion tenderness.  Uterus is freely mobile and non-fixed.  No adnexal/parametria masses or tenderness noted.  Anus and perineum are without rashes or lesions.   ***  Neurologic: Grossly intact, no focal deficits, moving all 4 extremities. Psychiatric: Normal mood and affect.    Laboratory Data: Comprehensive Metabolic Panel (CMP) Order: [id number] Component Ref Range & Units 6 mo ago  Glucose 70 - 110 mg/dL 782  Sodium 956 - 213 mmol/L 142  Potassium 3.6 - 5.1 mmol/L 4.0  Chloride 97 - 109 mmol/L 107  Carbon Dioxide (CO2) 22.0 - 32.0 mmol/L 28.7  Urea Nitrogen (BUN) 7 - 25 mg/dL 18  Creatinine 0.6 - 1.1 mg/dL 1.0  Glomerular Filtration Rate (eGFR) >60 mL/min/1.73sq m 65  Comment: CKD-EPI (2021) does not include patient's race in the calculation of eGFR.  Monitoring changes of plasma creatinine and eGFR over time is useful for monitoring kidney function.  Interpretive Ranges for eGFR (CKD-EPI 2021):  eGFR:       >60 mL/min/1.73 sq. m - Normal eGFR:       30-59 mL/min/1.73 sq. m - Moderately Decreased eGFR:       15-29 mL/min/1.73 sq. m  - Severely Decreased eGFR:       < 15 mL/min/1.73 sq. m  - Kidney Failure   Note: These eGFR calculations do not apply in acute situations when eGFR is changing rapidly or patients on dialysis.  Calcium 8.7 - 10.3 mg/dL 8.5 Low   AST 8 - 39 U/L 20  ALT 5 - 38 U/L 19  Alk Phos (alkaline Phosphatase) 34 - 104 U/L 51  Albumin 3.5 - 4.8 g/dL 3.6  Bilirubin, Total 0.3 - 1.2 mg/dL 0.4  Protein, Total 6.1 - 7.9 g/dL 5.5 Low   A/G Ratio 1.0 - 5.0 gm/dL 1.9  Resulting Agency [facility]   Specimen Collected: 07/15/22 07:27   Performed by: [facility] Last Resulted: 07/15/22 13:31  Received From: [facility]  Result Received: 07/22/22 09:47    Urinalysis w/Microscopic Order: [id number] Component Ref Range & Units 6 mo ago  Color Colorless, Straw, Light Yellow, Yellow, Dark Yellow Light Yellow  Clarity Clear Clear  Specific Gravity 1.005 - 1.030 1.023  pH, Urine 5.0 - 8.0 6.5  Protein, Urinalysis Negative mg/dL Negative  Glucose, Urinalysis Negative mg/dL Negative  Ketones, Urinalysis Negative mg/dL Negative  Blood, Urinalysis Negative 2+ Abnormal   Nitrite, Urinalysis Negative Negative  Leukocyte Esterase, Urinalysis Negative Negative  Bilirubin, Urinalysis Negative Negative  Urobilinogen,  Urinalysis 0.2 - 1.0 mg/dL 0.2  WBC, UA <=5 /hpf <1  Red Blood Cells, Urinalysis <=3 /hpf 19 High   Bacteria, Urinalysis 0 - 5 /hpf 0-5  Squamous Epithelial Cells, Urinalysis /hpf 0  Resulting Agency [facility]  Narrative  This result has an attachment that is not available.   Specimen Collected: 07/15/22 07:27   Performed by: [facility] Last Resulted: 07/15/22 10:01  Received From: [facility]  Result Received: 07/22/22 09:47    Urinalysis *** I have reviewed the labs.   Assessment & Plan:    1. Intermediate risk hematuria -work up in 2022 - NED -no reports of gross heme - UA benign  - She denies any kidney stone episodes.  - Encourage her to continue cessation from smoking  -Explained that she has had an increase in the degree of her microscopic hematuria and that it was recommended she undergo CT urogram and repeat cystoscopy at this time especially since she is a smoker she is at more increased risk of kidney cancer or bladder cancer -CT Urogram is ordered -we discussed that for a CT urogram a contrast material will be injected into a vein and that in  rare instances, an allergic reaction can result and may even life threatening (1:100,000)  The patient denies any allergies to contrast***, iodine and/or seafood*** and is not taking metformin.*** - we discussed that following the imaging study,  a cystoscopy is performed  -we discussed that a cystoscopy is a procedure that consists of passing a camera up their urethra after administering lidocaine to anesthetize and that after the procedure a minor amount of blood in the urine and/or burning which usually resolves in 24 to 48 hours may occur *** -the patient had the opportunity to ask questions which were answered. Based upon this discussion, the patient is willing to proceed. Therefore, I've ordered: a CT Urogram and cystoscopy *** - The patient will return following all of the above for discussion of the results. *** - UA *** - Urine culture *** - BMP or recent creatinine ***     No follow-ups on file.  Cloretta Ned   [facility] [address], [address] [place], [place] [address] [phone]

## 2023-01-14 ENCOUNTER — Ambulatory Visit: Payer: Managed Care, Other (non HMO) | Admitting: Urology

## 2023-01-18 ENCOUNTER — Encounter: Payer: Self-pay | Admitting: Urology

## 2023-08-23 ENCOUNTER — Inpatient Hospital Stay: Admission: RE | Admit: 2023-08-23 | Payer: Managed Care, Other (non HMO) | Source: Ambulatory Visit

## 2023-12-28 ENCOUNTER — Encounter: Payer: Self-pay | Admitting: Infectious Diseases

## 2023-12-28 ENCOUNTER — Other Ambulatory Visit: Payer: Self-pay | Admitting: Infectious Diseases

## 2023-12-28 DIAGNOSIS — Z1231 Encounter for screening mammogram for malignant neoplasm of breast: Secondary | ICD-10-CM

## 2024-01-31 ENCOUNTER — Ambulatory Visit
Admission: RE | Admit: 2024-01-31 | Discharge: 2024-01-31 | Disposition: A | Discharge status: Home / Self Care | Source: Ambulatory Visit | Attending: Infectious Diseases | Admitting: Infectious Diseases

## 2024-01-31 DIAGNOSIS — Z1231 Encounter for screening mammogram for malignant neoplasm of breast: Secondary | ICD-10-CM | POA: Diagnosis present
